# Patient Record
Sex: Male | Born: 1965 | Race: Black or African American | Hispanic: No | Marital: Married | Smoking: Never smoker
Health system: Southern US, Community
[De-identification: ages and names within clinical notes are randomized; demographics above are authoritative.]

---

## 2015-10-15 ENCOUNTER — Encounter (HOSPITAL_COMMUNITY): Payer: Self-pay | Admitting: Physical Medicine and Rehabilitation

## 2015-10-15 ENCOUNTER — Emergency Department (HOSPITAL_COMMUNITY): Payer: No Typology Code available for payment source

## 2015-10-15 ENCOUNTER — Emergency Department (HOSPITAL_COMMUNITY)
Admission: EM | Admit: 2015-10-15 | Discharge: 2015-10-15 | Disposition: A | Discharge status: Home / Self Care | Payer: No Typology Code available for payment source | Attending: Emergency Medicine | Admitting: Emergency Medicine

## 2015-10-15 DIAGNOSIS — S3992XA Unspecified injury of lower back, initial encounter: Secondary | ICD-10-CM | POA: Insufficient documentation

## 2015-10-15 DIAGNOSIS — M549 Dorsalgia, unspecified: Secondary | ICD-10-CM

## 2015-10-15 DIAGNOSIS — Y9389 Activity, other specified: Secondary | ICD-10-CM | POA: Insufficient documentation

## 2015-10-15 DIAGNOSIS — S0990XA Unspecified injury of head, initial encounter: Secondary | ICD-10-CM | POA: Insufficient documentation

## 2015-10-15 DIAGNOSIS — S4992XA Unspecified injury of left shoulder and upper arm, initial encounter: Secondary | ICD-10-CM | POA: Insufficient documentation

## 2015-10-15 DIAGNOSIS — S199XXA Unspecified injury of neck, initial encounter: Secondary | ICD-10-CM | POA: Diagnosis not present

## 2015-10-15 DIAGNOSIS — S4991XA Unspecified injury of right shoulder and upper arm, initial encounter: Secondary | ICD-10-CM | POA: Diagnosis not present

## 2015-10-15 DIAGNOSIS — S299XXA Unspecified injury of thorax, initial encounter: Secondary | ICD-10-CM | POA: Diagnosis not present

## 2015-10-15 DIAGNOSIS — Y92410 Unspecified street and highway as the place of occurrence of the external cause: Secondary | ICD-10-CM | POA: Insufficient documentation

## 2015-10-15 DIAGNOSIS — M542 Cervicalgia: Secondary | ICD-10-CM

## 2015-10-15 DIAGNOSIS — S8992XA Unspecified injury of left lower leg, initial encounter: Secondary | ICD-10-CM | POA: Diagnosis present

## 2015-10-15 DIAGNOSIS — Y999 Unspecified external cause status: Secondary | ICD-10-CM | POA: Diagnosis not present

## 2015-10-15 DIAGNOSIS — M25562 Pain in left knee: Secondary | ICD-10-CM

## 2015-10-15 MED ORDER — CYCLOBENZAPRINE HCL 10 MG PO TABS: 10.0000 mg | ORAL_TABLET | Freq: Three times a day (TID) | ORAL | Status: DC | PRN

## 2015-10-15 MED ORDER — DIAZEPAM 5 MG PO TABS: 5.0000 mg | ORAL_TABLET | Freq: Two times a day (BID) | ORAL | Status: DC | PRN

## 2015-10-15 MED ORDER — IBUPROFEN 800 MG PO TABS: 800.0000 mg | ORAL_TABLET | Freq: Three times a day (TID) | ORAL | Status: DC | PRN
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
## 2015-10-15 NOTE — ED Notes (Signed)
Declined W/C at D/C and was escorted to lobby by RN. 
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
## 2022-12-23 ENCOUNTER — Emergency Department (HOSPITAL_BASED_OUTPATIENT_CLINIC_OR_DEPARTMENT_OTHER): Payer: No Typology Code available for payment source

## 2022-12-23 ENCOUNTER — Other Ambulatory Visit: Payer: Self-pay

## 2022-12-23 ENCOUNTER — Inpatient Hospital Stay (HOSPITAL_COMMUNITY)
Admission: EM | Disposition: A | Discharge status: Home / Self Care | Payer: Self-pay | Source: Home / Self Care | Attending: Cardiology

## 2022-12-23 ENCOUNTER — Inpatient Hospital Stay (HOSPITAL_BASED_OUTPATIENT_CLINIC_OR_DEPARTMENT_OTHER)
Admission: EM | Admit: 2022-12-23 | Discharge: 2022-12-30 | DRG: 280 | Disposition: A | Discharge status: Home / Self Care | Payer: No Typology Code available for payment source | Attending: Cardiology | Admitting: Cardiology

## 2022-12-23 DIAGNOSIS — I2111 ST elevation (STEMI) myocardial infarction involving right coronary artery: Secondary | ICD-10-CM

## 2022-12-23 DIAGNOSIS — Z881 Allergy status to other antibiotic agents status: Secondary | ICD-10-CM | POA: Diagnosis not present

## 2022-12-23 DIAGNOSIS — I2119 ST elevation (STEMI) myocardial infarction involving other coronary artery of inferior wall: Secondary | ICD-10-CM | POA: Diagnosis present

## 2022-12-23 DIAGNOSIS — I441 Atrioventricular block, second degree: Secondary | ICD-10-CM | POA: Diagnosis not present

## 2022-12-23 DIAGNOSIS — E1165 Type 2 diabetes mellitus with hyperglycemia: Secondary | ICD-10-CM | POA: Diagnosis present

## 2022-12-23 DIAGNOSIS — Y9367 Activity, basketball: Secondary | ICD-10-CM | POA: Diagnosis not present

## 2022-12-23 DIAGNOSIS — I252 Old myocardial infarction: Secondary | ICD-10-CM

## 2022-12-23 DIAGNOSIS — I442 Atrioventricular block, complete: Secondary | ICD-10-CM | POA: Diagnosis present

## 2022-12-23 DIAGNOSIS — E118 Type 2 diabetes mellitus with unspecified complications: Secondary | ICD-10-CM | POA: Insufficient documentation

## 2022-12-23 DIAGNOSIS — E11649 Type 2 diabetes mellitus with hypoglycemia without coma: Secondary | ICD-10-CM | POA: Diagnosis not present

## 2022-12-23 DIAGNOSIS — N179 Acute kidney failure, unspecified: Secondary | ICD-10-CM | POA: Diagnosis not present

## 2022-12-23 DIAGNOSIS — I110 Hypertensive heart disease with heart failure: Secondary | ICD-10-CM | POA: Diagnosis present

## 2022-12-23 DIAGNOSIS — I10 Essential (primary) hypertension: Secondary | ICD-10-CM | POA: Insufficient documentation

## 2022-12-23 DIAGNOSIS — E785 Hyperlipidemia, unspecified: Secondary | ICD-10-CM | POA: Diagnosis present

## 2022-12-23 DIAGNOSIS — I319 Disease of pericardium, unspecified: Secondary | ICD-10-CM | POA: Diagnosis present

## 2022-12-23 DIAGNOSIS — Z833 Family history of diabetes mellitus: Secondary | ICD-10-CM

## 2022-12-23 DIAGNOSIS — I5021 Acute systolic (congestive) heart failure: Secondary | ICD-10-CM | POA: Diagnosis present

## 2022-12-23 DIAGNOSIS — I213 ST elevation (STEMI) myocardial infarction of unspecified site: Secondary | ICD-10-CM | POA: Diagnosis present

## 2022-12-23 DIAGNOSIS — I2510 Atherosclerotic heart disease of native coronary artery without angina pectoris: Secondary | ICD-10-CM | POA: Diagnosis present

## 2022-12-23 DIAGNOSIS — Z8249 Family history of ischemic heart disease and other diseases of the circulatory system: Secondary | ICD-10-CM | POA: Diagnosis not present

## 2022-12-23 DIAGNOSIS — I4430 Unspecified atrioventricular block: Secondary | ICD-10-CM | POA: Diagnosis not present

## 2022-12-23 DIAGNOSIS — Z91199 Patient's noncompliance with other medical treatment and regimen due to unspecified reason: Secondary | ICD-10-CM

## 2022-12-23 DIAGNOSIS — I5020 Unspecified systolic (congestive) heart failure: Secondary | ICD-10-CM | POA: Insufficient documentation

## 2022-12-23 DIAGNOSIS — I255 Ischemic cardiomyopathy: Secondary | ICD-10-CM | POA: Diagnosis present

## 2022-12-23 DIAGNOSIS — X58XXXA Exposure to other specified factors, initial encounter: Secondary | ICD-10-CM | POA: Diagnosis present

## 2022-12-23 DIAGNOSIS — N289 Disorder of kidney and ureter, unspecified: Secondary | ICD-10-CM | POA: Diagnosis not present

## 2022-12-23 HISTORY — PX: LEFT HEART CATH AND CORONARY ANGIOGRAPHY: CATH118249

## 2022-12-23 HISTORY — PX: CORONARY/GRAFT ACUTE MI REVASCULARIZATION: CATH118305

## 2022-12-23 LAB — CBC WITH DIFFERENTIAL/PLATELET
Abs Immature Granulocytes: 0.07 10*3/uL (ref 0.00–0.07)
Basophils Absolute: 0 10*3/uL (ref 0.0–0.1)
Basophils Relative: 0 %
Eosinophils Absolute: 0 10*3/uL (ref 0.0–0.5)
Eosinophils Relative: 0 %
HCT: 38.7 % — ABNORMAL LOW (ref 39.0–52.0)
Hemoglobin: 13.1 g/dL (ref 13.0–17.0)
Immature Granulocytes: 1 %
Lymphocytes Relative: 7 %
Lymphs Abs: 0.9 10*3/uL (ref 0.7–4.0)
MCH: 29.2 pg (ref 26.0–34.0)
MCHC: 33.9 g/dL (ref 30.0–36.0)
MCV: 86.2 fL (ref 80.0–100.0)
Monocytes Absolute: 1.8 10*3/uL — ABNORMAL HIGH (ref 0.1–1.0)
Monocytes Relative: 14 %
Neutro Abs: 10.2 10*3/uL — ABNORMAL HIGH (ref 1.7–7.7)
Neutrophils Relative %: 78 %
Platelets: 284 10*3/uL (ref 150–400)
RBC: 4.49 MIL/uL (ref 4.22–5.81)
RDW: 12.5 % (ref 11.5–15.5)
WBC: 13.1 10*3/uL — ABNORMAL HIGH (ref 4.0–10.5)
nRBC: 0 % (ref 0.0–0.2)

## 2022-12-23 LAB — COMPREHENSIVE METABOLIC PANEL
ALT: 28 U/L (ref 0–44)
AST: 53 U/L — ABNORMAL HIGH (ref 15–41)
Albumin: 3.8 g/dL (ref 3.5–5.0)
Alkaline Phosphatase: 92 U/L (ref 38–126)
Anion gap: 11 (ref 5–15)
BUN: 25 mg/dL — ABNORMAL HIGH (ref 6–20)
CO2: 25 mmol/L (ref 22–32)
Calcium: 8.5 mg/dL — ABNORMAL LOW (ref 8.9–10.3)
Chloride: 96 mmol/L — ABNORMAL LOW (ref 98–111)
Creatinine, Ser: 1.3 mg/dL — ABNORMAL HIGH (ref 0.61–1.24)
GFR, Estimated: >60 mL/min (ref >60)
Glucose, Bld: 342 mg/dL — ABNORMAL HIGH (ref 70–99)
Potassium: 3.8 mmol/L (ref 3.5–5.1)
Sodium: 132 mmol/L — ABNORMAL LOW (ref 135–145)
Total Bilirubin: 1.3 mg/dL — ABNORMAL HIGH (ref 0.3–1.2)
Total Protein: 7.8 g/dL (ref 6.5–8.1)

## 2022-12-23 LAB — LIPID PANEL

## 2022-12-23 LAB — TROPONIN I (HIGH SENSITIVITY): Troponin I (High Sensitivity): 4852 ng/L (ref <18)

## 2022-12-23 LAB — PROTIME-INR
INR: 1.2 (ref 0.8–1.2)
Prothrombin Time: 14.6 seconds (ref 11.4–15.2)

## 2022-12-23 LAB — APTT: aPTT: 30 seconds (ref 24–36)

## 2022-12-23 SURGERY — CORONARY/GRAFT ACUTE MI REVASCULARIZATION
Anesthesia: LOCAL

## 2022-12-23 MED ORDER — HEPARIN (PORCINE) IN NACL 1000-0.9 UT/500ML-% IV SOLN
INTRAVENOUS | Status: DC | PRN
  Administered 2022-12-23 (×2): 500 mL

## 2022-12-23 MED ORDER — CHLORHEXIDINE GLUCONATE CLOTH 2 % EX PADS: 6.0000 | MEDICATED_PAD | Freq: Every day | CUTANEOUS | Status: DC

## 2022-12-23 MED ORDER — IOHEXOL 350 MG/ML SOLN
INTRAVENOUS | Status: DC | PRN
  Administered 2022-12-23: 40 mL via INTRA_ARTERIAL

## 2022-12-23 MED ORDER — HEPARIN SODIUM (PORCINE) 5000 UNIT/ML IJ SOLN
4000.0000 [IU] | Freq: Once | INTRAMUSCULAR | Status: AC
  Administered 2022-12-23: 4000 [IU] via INTRAVENOUS

## 2022-12-23 MED ORDER — SODIUM CHLORIDE 0.9 % IV SOLN: INTRAVENOUS | Status: DC

## 2022-12-23 MED ORDER — ASPIRIN 81 MG PO CHEW
324.0000 mg | CHEWABLE_TABLET | Freq: Once | ORAL | Status: AC
  Administered 2022-12-23: 324 mg via ORAL
  Filled 2022-12-23: qty 4

## 2022-12-23 MED ORDER — HEPARIN SODIUM (PORCINE) 5000 UNIT/ML IJ SOLN
INTRAMUSCULAR | Status: AC
  Filled 2022-12-23: qty 1

## 2022-12-23 MED ORDER — HEPARIN (PORCINE) 25000 UT/250ML-% IV SOLN
1200.0000 [IU]/h | INTRAVENOUS | Status: DC
  Administered 2022-12-23: 1200 [IU]/h via INTRAVENOUS
  Filled 2022-12-23: qty 250

## 2022-12-23 MED ORDER — NITROGLYCERIN 0.4 MG SL SUBL
0.4000 mg | SUBLINGUAL_TABLET | SUBLINGUAL | Status: DC | PRN
  Administered 2022-12-23 (×2): 0.4 mg via SUBLINGUAL
  Filled 2022-12-23: qty 1

## 2022-12-23 MED ORDER — HEPARIN SODIUM (PORCINE) 1000 UNIT/ML IJ SOLN
INTRAMUSCULAR | Status: DC | PRN
  Administered 2022-12-23: 4500 [IU] via INTRAVENOUS

## 2022-12-23 MED ORDER — CLOPIDOGREL BISULFATE 300 MG PO TABS
600.0000 mg | ORAL_TABLET | Freq: Once | ORAL | Status: DC
  Filled 2022-12-23: qty 2

## 2022-12-23 MED ORDER — VERAPAMIL HCL 2.5 MG/ML IV SOLN
INTRAVENOUS | Status: DC | PRN
  Administered 2022-12-23: 10 mL via INTRA_ARTERIAL

## 2022-12-23 MED ORDER — ORAL CARE MOUTH RINSE: 15.0000 mL | OROMUCOSAL | Status: DC | PRN

## 2022-12-23 SURGICAL SUPPLY — 11 items
CATH 5FR JL3.5 JR4 ANG PIG MP (CATHETERS) IMPLANT
DEVICE RAD COMP TR BAND LRG (VASCULAR PRODUCTS) IMPLANT
ELECT DEFIB PAD ADLT CADENCE (PAD) IMPLANT
GLIDESHEATH SLEND SS 6F .021 (SHEATH) IMPLANT
GUIDEWIRE INQWIRE 1.5J.035X260 (WIRE) IMPLANT
INQWIRE 1.5J .035X260CM (WIRE) ×1
KIT ENCORE 26 ADVANTAGE (KITS) IMPLANT
KIT HEART LEFT (KITS) ×1 IMPLANT
PACK CARDIAC CATHETERIZATION (CUSTOM PROCEDURE TRAY) ×1 IMPLANT
TRANSDUCER W/STOPCOCK (MISCELLANEOUS) ×1 IMPLANT
TUBING CIL FLEX 10 FLL-RA (TUBING) ×1 IMPLANT

## 2022-12-23 NOTE — ED Triage Notes (Addendum)
Pt has had CP since Friday and worse since Saturday especially since playing basketball - got SOB and weak and cannot take a full breath.  Pt has not taken his BP meds since Saturday d/t thinking that was what was causing pain.

## 2022-12-23 NOTE — Discharge Instructions (Signed)
You are being transported directly to the [facility] for a heart attack

## 2022-12-23 NOTE — ED Provider Notes (Signed)
[place] [facility] AT [facility] Provider Note   CSN: [id number] Arrival date & time: 12/23/22  2154     History  Chief Complaint  Patient presents with   Code STEMI    Ryan Petersen is a 57 y.o. male.   Chest Pain     57 year old male with medical history significant for hypertension who presents to the emergency department with substernal chest pain.  He states that the pain started on Saturday.  He noticed it especially while playing basketball.  He felt weak and shortness of breath and has been feeling somewhat diaphoretic.  He has felt lightheaded as well with no syncope.  No ripping or tearing component of his chest pain.  No radiation of his chest pain to the back.  No red migratory component.  He describes it as a chest pressure sitting on the center of his chest.  He initially thought it was because he did not take his blood pressure medications and that is why he was having pain.  He did not seek care until now.  He arrived to the emergency department where he had ST segment elevations in leads II, III and aVF with Q waves consistent with STEMI and a code STEMI was called on patient arrival. Home Medications Prior to Admission medications   Medication Sig Start Date End Date Taking? Authorizing Provider  cyclobenzaprine (FLEXERIL) 10 MG tablet Take 1 tablet (10 mg total) by mouth 3 (three) times daily as needed for muscle spasms (or pain). 10/15/15   Trixie Dredge, PA-C  diazepam (VALIUM) 5 MG tablet Take 1 tablet (5 mg total) by mouth every 12 (twelve) hours as needed for muscle spasms (spasms). 10/15/15   Trixie Dredge, PA-C  ibuprofen (ADVIL,MOTRIN) 800 MG tablet Take 1 tablet (800 mg total) by mouth every 8 (eight) hours as needed for mild pain or moderate pain. 10/15/15   Trixie Dredge, PA-C      Allergies    Patient has no known allergies.    Review of Systems   Review of Systems  Cardiovascular:  Positive for chest pain.  All other systems reviewed  and are negative.   Physical Exam Updated Vital Signs BP (!) 179/96   Pulse (!) 125   Temp 99.1 F (37.3 C)   Resp 17   Ht 5\' 10"  (1.778 m)   Wt 87.1 kg   SpO2 97%   BMI 27.55 kg/m  Physical Exam Vitals and nursing note reviewed.  Constitutional:      General: He is not in acute distress.    Appearance: He is well-developed. He is diaphoretic.     Comments: Patient ill-appearing and diaphoretic  HENT:     Head: Normocephalic and atraumatic.  Eyes:     Conjunctiva/sclera: Conjunctivae normal.  Cardiovascular:     Rate and Rhythm: Regular rhythm. Tachycardia present.     Pulses: Normal pulses.  Pulmonary:     Effort: Pulmonary effort is normal. No respiratory distress.     Breath sounds: Normal breath sounds.  Abdominal:     Palpations: Abdomen is soft.     Tenderness: There is no abdominal tenderness.  Musculoskeletal:        General: No swelling.     Cervical back: Neck supple.  Skin:    General: Skin is warm.     Capillary Refill: Capillary refill takes less than 2 seconds.  Neurological:     Mental Status: He is alert.  Psychiatric:  Mood and Affect: Mood normal.     ED Results / Procedures / Treatments   Labs (all labs ordered are listed, but only abnormal results are displayed) Labs Reviewed  CBC WITH DIFFERENTIAL/PLATELET - Abnormal; Notable for the following components:      Result Value   WBC 13.1 (*)    HCT 38.7 (*)    All other components within normal limits  PROTIME-INR  APTT  HEMOGLOBIN A1C  COMPREHENSIVE METABOLIC PANEL  LIPID PANEL  TROPONIN I (HIGH SENSITIVITY)    EKG EKG Interpretation  Date/Time:  Monday December 23 2022 22:03:15 EDT Ventricular Rate:  123 PR Interval:  196 QRS Duration: 114 QT Interval:  296 QTC Calculation: 424 R Axis:   83 Text Interpretation: Sinus tachycardia Borderline prolonged PR interval LAE, consider biatrial enlargement Inferior infarct, acute (LCx) Lateral leads are also involved >>> Acute MI  <<< ** ** ACUTE MI / STEMI ** ** Confirmed by Ernie Avena ([id number]) on 12/23/2022 10:10:52 PM  Radiology No results found.  Procedures .Critical Care  Performed by: Ernie Avena, MD Authorized by: Ernie Avena, MD   Critical care provider statement:    Critical care time (minutes):  30   Critical care was necessary to treat or prevent imminent or life-threatening deterioration of the following conditions:  Cardiac failure   Critical care was time spent personally by me on the following activities:  Development of treatment plan with patient or surrogate, discussions with consultants, evaluation of patient's response to treatment, examination of patient, ordering and review of laboratory studies, ordering and review of radiographic studies, ordering and performing treatments and interventions, pulse oximetry, re-evaluation of patient's condition and review of old charts   Care discussed with: accepting provider at another facility       Medications Ordered in ED Medications  0.9 %  sodium chloride infusion ( Intravenous New Bag/Given 12/23/22 2214)  nitroGLYCERIN (NITROSTAT) SL tablet 0.4 mg (0.4 mg Sublingual Given 12/23/22 2228)  heparin ADULT infusion 100 units/mL (25000 units/243mL) (1,200 Units/hr Intravenous New Bag/Given 12/23/22 2231)  aspirin chewable tablet 324 mg (324 mg Oral Given 12/23/22 2210)  heparin injection 4,000 Units (4,000 Units Intravenous Given 12/23/22 2213)    ED Course/ Medical Decision Making/ A&P                             Medical Decision Making Amount and/or Complexity of Data Reviewed Labs: ordered. Radiology: ordered.  Risk OTC drugs. Prescription drug management.   57 year old male with medical history significant for hypertension who presents to the emergency department with substernal chest pain.  He states that the pain started on Saturday.  He noticed it especially while playing basketball.  He felt weak and shortness of breath and has been  feeling somewhat diaphoretic.  He has felt lightheaded as well with no syncope.  No ripping or tearing component of his chest pain.  No radiation of his chest pain to the back.  No red migratory component.  He describes it as a chest pressure sitting on the center of his chest.  He initially thought it was because he did not take his blood pressure medications and that is why he was having pain.  He did not seek care until now.  He arrived to the emergency department where he had ST segment elevations in leads II, III and aVF with Q waves consistent with STEMI and a code STEMI was called on patient arrival.  Initially  care EGD revealed sinus tachycardia, ventricular rate 123, inferior infarct with ST segment elevations in leads II, III and aVF with associated Q waves, ST depressions in lead aVL.   Discussed with on-call STEMI doctor, Dr. Swaziland who agreed with code STEMI.  The patient was emergently bedded and was administered aspirin, heparin bolus and gtt.  He remained hemodynamically stable.  Low concern for aortic dissection with no ripping or tearing component, symmetric and equal pulses.  He was placed on cardiac Zoll monitoring.  [facility] was contacted for emergent transfer to [facility] for consideration for PCI, Dr. Swaziland accepting.   Final Clinical Impression(s) / ED Diagnoses Final diagnoses:  ST elevation myocardial infarction (STEMI), unspecified artery    Rx / DC Orders ED Discharge Orders     None         Ernie Avena, MD 12/23/22 2233

## 2022-12-23 NOTE — Progress Notes (Signed)
ANTICOAGULATION CONSULT NOTE - Initial Consult  Pharmacy Consult for Heparin Indication: chest pain/ACS  No Known Allergies  Patient Measurements: Height: 5\' 10"  (177.8 cm) Weight: 87.1 kg (192 lb) IBW/kg (Calculated) : 73 Heparin Dosing Weight: 87.1 kg  Vital Signs: Temp: 99.1 F (37.3 C) (04/15 2159) BP: 177/109 (04/15 2159) Pulse Rate: 127 (04/15 2159)  Labs: Recent Labs    12/23/22 2210  HGB 13.1  HCT 38.7*  PLT 284    CrCl cannot be calculated (No successful lab value found.).   Medical History: No past medical history on file.  Medications:  (Not in a hospital admission)  Scheduled:  Infusions:   sodium chloride 20 mL/hr at 12/23/22 2214   PRN: nitroGLYCERIN  Assessment: 56 yom with a history of HTN. Patient is presenting with chest pain. Code STEMI activated. Heparin per pharmacy consult placed for chest pain/ACS.  Patient is not on anticoagulation prior to arrival. [facility] ED gave 4000 unit IV heparin prior to pharmacy consult. Patient is pending [facility] transport to [facility] for STEMI activation/evaluation.  CBC pending  Goal of Therapy:  Heparin level 0.3-0.7 units/ml Monitor platelets by anticoagulation protocol: Yes   Plan:  4000 unit IV heparin bolus already given Start heparin infusion at 1200 units/hr Check anti-Xa level in 6-8 hours and daily while on heparin --Heparin levels not ordered as will need to be re-ordered on arrival to [facility] and may go to cath lab on arrival anyway Continue to monitor H&H and platelets  Delmar Landau, PharmD, BCPS 12/23/2022 10:25 PM ED Clinical Pharmacist -  [phone]

## 2022-12-23 NOTE — ED Notes (Signed)
Called [facility] for code STEMI and spoke to Amo.

## 2022-12-24 ENCOUNTER — Inpatient Hospital Stay (HOSPITAL_COMMUNITY): Payer: No Typology Code available for payment source

## 2022-12-24 ENCOUNTER — Encounter (HOSPITAL_COMMUNITY): Payer: Self-pay | Admitting: Cardiology

## 2022-12-24 ENCOUNTER — Encounter (HOSPITAL_COMMUNITY)
Admission: EM | Disposition: A | Discharge status: Home / Self Care | Payer: Self-pay | Source: Home / Self Care | Attending: Cardiology

## 2022-12-24 DIAGNOSIS — I213 ST elevation (STEMI) myocardial infarction of unspecified site: Secondary | ICD-10-CM

## 2022-12-24 DIAGNOSIS — I441 Atrioventricular block, second degree: Secondary | ICD-10-CM

## 2022-12-24 DIAGNOSIS — E11649 Type 2 diabetes mellitus with hypoglycemia without coma: Secondary | ICD-10-CM

## 2022-12-24 DIAGNOSIS — I110 Hypertensive heart disease with heart failure: Secondary | ICD-10-CM | POA: Diagnosis not present

## 2022-12-24 DIAGNOSIS — I5021 Acute systolic (congestive) heart failure: Secondary | ICD-10-CM | POA: Diagnosis not present

## 2022-12-24 DIAGNOSIS — I10 Essential (primary) hypertension: Secondary | ICD-10-CM

## 2022-12-24 DIAGNOSIS — I2111 ST elevation (STEMI) myocardial infarction involving right coronary artery: Secondary | ICD-10-CM | POA: Diagnosis not present

## 2022-12-24 DIAGNOSIS — N289 Disorder of kidney and ureter, unspecified: Secondary | ICD-10-CM

## 2022-12-24 DIAGNOSIS — R079 Chest pain, unspecified: Secondary | ICD-10-CM

## 2022-12-24 DIAGNOSIS — I442 Atrioventricular block, complete: Secondary | ICD-10-CM | POA: Diagnosis not present

## 2022-12-24 DIAGNOSIS — I4430 Unspecified atrioventricular block: Secondary | ICD-10-CM | POA: Diagnosis not present

## 2022-12-24 HISTORY — PX: TEMPORARY PACEMAKER: CATH118268

## 2022-12-24 LAB — ECHOCARDIOGRAM COMPLETE
Height: 70 in
S' Lateral: 3.7 cm
Weight: 3072 oz

## 2022-12-24 LAB — BASIC METABOLIC PANEL
Anion gap: 15 (ref 5–15)
BUN: 23 mg/dL — ABNORMAL HIGH (ref 6–20)
CO2: 23 mmol/L (ref 22–32)
Calcium: 8.6 mg/dL — ABNORMAL LOW (ref 8.9–10.3)
Chloride: 95 mmol/L — ABNORMAL LOW (ref 98–111)
Creatinine, Ser: 1.41 mg/dL — ABNORMAL HIGH (ref 0.61–1.24)
GFR, Estimated: 58 mL/min — ABNORMAL LOW (ref >60)
Glucose, Bld: 358 mg/dL — ABNORMAL HIGH (ref 70–99)
Potassium: 3.9 mmol/L (ref 3.5–5.1)
Sodium: 133 mmol/L — ABNORMAL LOW (ref 135–145)

## 2022-12-24 LAB — GLUCOSE, CAPILLARY
Glucose-Capillary: 388 mg/dL — ABNORMAL HIGH (ref 70–99)
Glucose-Capillary: 310 mg/dL — ABNORMAL HIGH (ref 70–99)
Glucose-Capillary: 282 mg/dL — ABNORMAL HIGH (ref 70–99)
Glucose-Capillary: 195 mg/dL — ABNORMAL HIGH (ref 70–99)
Glucose-Capillary: 169 mg/dL — ABNORMAL HIGH (ref 70–99)

## 2022-12-24 LAB — HEMOGLOBIN A1C
Hgb A1c MFr Bld: 13.2 % — ABNORMAL HIGH (ref 4.8–5.6)
Mean Plasma Glucose: 332.14 mg/dL

## 2022-12-24 LAB — SEDIMENTATION RATE: Sed Rate: 80 mm/hr — ABNORMAL HIGH (ref 0–16)

## 2022-12-24 LAB — LIPID PANEL
Cholesterol: 189 mg/dL (ref 0–200)
Total CHOL/HDL Ratio: 3.4 RATIO
Triglycerides: 106 mg/dL (ref <150)

## 2022-12-24 LAB — MAGNESIUM: Magnesium: 2 mg/dL (ref 1.7–2.4)

## 2022-12-24 LAB — TROPONIN I (HIGH SENSITIVITY): Troponin I (High Sensitivity): 4524 ng/L (ref <18)

## 2022-12-24 LAB — HEPARIN LEVEL (UNFRACTIONATED): Heparin Unfractionated: <0.1 IU/mL — ABNORMAL LOW (ref 0.30–0.70)

## 2022-12-24 LAB — MRSA NEXT GEN BY PCR, NASAL: MRSA by PCR Next Gen: NOT DETECTED

## 2022-12-24 SURGERY — TEMPORARY PACEMAKER
Anesthesia: LOCAL

## 2022-12-24 MED ORDER — INSULIN ASPART 100 UNIT/ML IJ SOLN: 0.0000 [IU] | Freq: Every day | INTRAMUSCULAR | Status: DC

## 2022-12-24 MED ORDER — ONDANSETRON HCL 4 MG/2ML IJ SOLN: 4.0000 mg | Freq: Four times a day (QID) | INTRAMUSCULAR | Status: DC | PRN

## 2022-12-24 MED ORDER — CLOPIDOGREL BISULFATE 75 MG PO TABS
75.0000 mg | ORAL_TABLET | Freq: Every day | ORAL | Status: DC
  Administered 2022-12-25 – 2022-12-30 (×6): 75 mg via ORAL
  Filled 2022-12-24 (×6): qty 1

## 2022-12-24 MED ORDER — NITROGLYCERIN IN D5W 200-5 MCG/ML-% IV SOLN
0.0000 ug/min | INTRAVENOUS | Status: DC
  Administered 2022-12-24: 5 ug/min via INTRAVENOUS
  Filled 2022-12-24: qty 250

## 2022-12-24 MED ORDER — MORPHINE SULFATE (PF) 2 MG/ML IV SOLN
2.0000 mg | INTRAVENOUS | Status: DC | PRN
  Administered 2022-12-24 – 2022-12-26 (×8): 2 mg via INTRAVENOUS
  Filled 2022-12-24 (×8): qty 1

## 2022-12-24 MED ORDER — MIDAZOLAM HCL 2 MG/2ML IJ SOLN
INTRAMUSCULAR | Status: DC | PRN
  Administered 2022-12-24 (×2): 1 mg via INTRAVENOUS

## 2022-12-24 MED ORDER — INSULIN ASPART 100 UNIT/ML IJ SOLN
0.0000 [IU] | Freq: Three times a day (TID) | INTRAMUSCULAR | Status: DC
  Administered 2022-12-24: 4 [IU] via SUBCUTANEOUS
  Administered 2022-12-25: 7 [IU] via SUBCUTANEOUS
  Administered 2022-12-25: 4 [IU] via SUBCUTANEOUS
  Administered 2022-12-25: 3 [IU] via SUBCUTANEOUS
  Administered 2022-12-26: 7 [IU] via SUBCUTANEOUS
  Administered 2022-12-26 – 2022-12-27 (×2): 3 [IU] via SUBCUTANEOUS
  Administered 2022-12-28 – 2022-12-29 (×2): 4 [IU] via SUBCUTANEOUS

## 2022-12-24 MED ORDER — ATORVASTATIN CALCIUM 80 MG PO TABS
80.0000 mg | ORAL_TABLET | Freq: Every day | ORAL | Status: DC
  Administered 2022-12-24 – 2022-12-30 (×7): 80 mg via ORAL
  Filled 2022-12-24 (×7): qty 1

## 2022-12-24 MED ORDER — LIDOCAINE HCL (PF) 1 % IJ SOLN
INTRAMUSCULAR | Status: DC | PRN
  Administered 2022-12-24: 10 mL

## 2022-12-24 MED ORDER — SODIUM CHLORIDE 0.9 % IV SOLN
250.0000 mL | INTRAVENOUS | Status: DC | PRN
  Administered 2022-12-27: 250 mL via INTRAVENOUS

## 2022-12-24 MED ORDER — ORAL CARE MOUTH RINSE: 15.0000 mL | OROMUCOSAL | Status: DC | PRN

## 2022-12-24 MED ORDER — CLOPIDOGREL BISULFATE 300 MG PO TABS
300.0000 mg | ORAL_TABLET | Freq: Once | ORAL | Status: AC
  Administered 2022-12-24: 300 mg via ORAL
  Filled 2022-12-24: qty 1

## 2022-12-24 MED ORDER — GABAPENTIN 300 MG PO CAPS
600.0000 mg | ORAL_CAPSULE | Freq: Two times a day (BID) | ORAL | Status: DC
  Administered 2022-12-24 – 2022-12-29 (×11): 600 mg via ORAL
  Filled 2022-12-24 (×11): qty 2

## 2022-12-24 MED ORDER — GABAPENTIN 300 MG PO CAPS
900.0000 mg | ORAL_CAPSULE | Freq: Three times a day (TID) | ORAL | Status: DC
  Administered 2022-12-24: 900 mg via ORAL
  Filled 2022-12-24 (×2): qty 3

## 2022-12-24 MED ORDER — INSULIN ASPART 100 UNIT/ML IJ SOLN
4.0000 [IU] | Freq: Three times a day (TID) | INTRAMUSCULAR | Status: DC
  Administered 2022-12-24 – 2022-12-29 (×14): 4 [IU] via SUBCUTANEOUS

## 2022-12-24 MED ORDER — ACETAMINOPHEN 325 MG PO TABS
650.0000 mg | ORAL_TABLET | ORAL | Status: DC | PRN
  Filled 2022-12-24: qty 2

## 2022-12-24 MED ORDER — TRAMADOL HCL 50 MG PO TABS
100.0000 mg | ORAL_TABLET | Freq: Two times a day (BID) | ORAL | Status: DC | PRN
  Administered 2022-12-24 – 2022-12-29 (×12): 100 mg via ORAL
  Filled 2022-12-24 (×12): qty 2

## 2022-12-24 MED ORDER — HYDRALAZINE HCL 20 MG/ML IJ SOLN: 10.0000 mg | INTRAMUSCULAR | Status: AC | PRN

## 2022-12-24 MED ORDER — COLCHICINE 0.6 MG PO TABS
0.6000 mg | ORAL_TABLET | Freq: Two times a day (BID) | ORAL | Status: DC
  Administered 2022-12-24 – 2022-12-30 (×13): 0.6 mg via ORAL
  Filled 2022-12-24 (×14): qty 1

## 2022-12-24 MED ORDER — SODIUM CHLORIDE 0.9 % WEIGHT BASED INFUSION
1.0000 mL/kg/h | INTRAVENOUS | Status: AC
  Administered 2022-12-24: 500 mL via INTRAVENOUS

## 2022-12-24 MED ORDER — ISOSORBIDE MONONITRATE ER 30 MG PO TB24
15.0000 mg | ORAL_TABLET | Freq: Every day | ORAL | Status: DC
  Administered 2022-12-24: 15 mg via ORAL
  Filled 2022-12-24: qty 1

## 2022-12-24 MED ORDER — SODIUM CHLORIDE 0.9% FLUSH
3.0000 mL | Freq: Two times a day (BID) | INTRAVENOUS | Status: DC
  Administered 2022-12-24 – 2022-12-30 (×13): 3 mL via INTRAVENOUS

## 2022-12-24 MED ORDER — HEPARIN (PORCINE) 25000 UT/250ML-% IV SOLN
1600.0000 [IU]/h | INTRAVENOUS | Status: AC
  Administered 2022-12-24: 1200 [IU]/h via INTRAVENOUS
  Administered 2022-12-25 (×2): 1600 [IU]/h via INTRAVENOUS
  Filled 2022-12-24 (×3): qty 250

## 2022-12-24 MED ORDER — ASPIRIN 81 MG PO CHEW
81.0000 mg | CHEWABLE_TABLET | Freq: Every day | ORAL | Status: DC
  Administered 2022-12-24 – 2022-12-30 (×7): 81 mg via ORAL
  Filled 2022-12-24 (×7): qty 1

## 2022-12-24 MED ORDER — HEPARIN BOLUS VIA INFUSION
2600.0000 [IU] | Freq: Once | INTRAVENOUS | Status: AC
  Administered 2022-12-24: 2600 [IU] via INTRAVENOUS
  Filled 2022-12-24: qty 2600

## 2022-12-24 MED ORDER — FENTANYL CITRATE (PF) 100 MCG/2ML IJ SOLN
INTRAMUSCULAR | Status: DC | PRN
  Administered 2022-12-24 (×2): 25 ug via INTRAVENOUS

## 2022-12-24 MED ORDER — CHLORHEXIDINE GLUCONATE CLOTH 2 % EX PADS
6.0000 | MEDICATED_PAD | Freq: Every day | CUTANEOUS | Status: DC
  Administered 2022-12-24 – 2022-12-30 (×7): 6 via TOPICAL

## 2022-12-24 MED ORDER — INSULIN DETEMIR 100 UNIT/ML ~~LOC~~ SOLN
15.0000 [IU] | Freq: Every day | SUBCUTANEOUS | Status: DC
  Administered 2022-12-24 – 2022-12-25 (×2): 15 [IU] via SUBCUTANEOUS
  Filled 2022-12-24 (×2): qty 0.15

## 2022-12-24 MED ORDER — SODIUM CHLORIDE 0.9% FLUSH: 3.0000 mL | INTRAVENOUS | Status: DC | PRN

## 2022-12-24 MED ORDER — INSULIN ASPART 100 UNIT/ML IJ SOLN
0.0000 [IU] | Freq: Three times a day (TID) | INTRAMUSCULAR | Status: DC
  Administered 2022-12-24: 8 [IU] via SUBCUTANEOUS
  Administered 2022-12-24: 11 [IU] via SUBCUTANEOUS

## 2022-12-24 MED FILL — Lidocaine HCl Local Preservative Free (PF) Inj 1%: INTRAMUSCULAR | Qty: 30 | Status: AC

## 2022-12-24 SURGICAL SUPPLY — 7 items
CABLE ADAPT PACING TEMP 12FT (ADAPTER) IMPLANT
ELECT DEFIB PAD ADLT CADENCE (PAD) IMPLANT
KIT MICROPUNCTURE NIT STIFF (SHEATH) IMPLANT
SHEATH PINNACLE 6F 10CM (SHEATH) IMPLANT
SHEATH PROBE COVER 6X72 (BAG) IMPLANT
SLEEVE REPOSITIONING LENGTH 30 (MISCELLANEOUS) IMPLANT
WIRE PACING TEMP ST TIP 5 (CATHETERS) IMPLANT

## 2022-12-24 NOTE — H&P (Signed)
Cardiology History & Physical    Patient ID: Ryan Petersen MRN: [id number], DOB/AGE: 10/16/1965   Admit date: 12/23/2022  Primary Physician: System, Provider Not In Primary Cardiologist: None  Patient Profile    Ryan Petersen is a 57 year old male with a history of type 2 DM, hypertension, hyperlipidemia, presenting with chest pain for the last 48 hours and found to have an inferior STEMI.   History of Present Illness    States he was a few minutes into playing basketball around noon on Saturday (its now Monday night) and began having sudden onset substernal chest pain that has been constant since then. He immediately stopped playing and has been sedentary since then. His wife and daughter ultimately convinced him to come to the ED for evaluation. He initially presented to [facility] and was found to have inferior ST elevation on ECG with mild ongoing chest pain. He does endorse mild associated dyspnea with onset, but this resolved with rest. He admits to poor medication adherence.   Taken emergently to cath lab and found to have occluded mid-RCA with developing left to right collaterals. Ventriculogram showed basal to mid inferior akinesis. Given delayed presentation, it was felt that there was no benefit for revascularization.   Past Medical History   type 2 DM, hypertension, hyperlipidemia  Allergies No Known Allergies  Home Medications    Prior to Admission medications   Medication Sig Start Date End Date Taking? Authorizing Provider  cyclobenzaprine (FLEXERIL) 10 MG tablet Take 1 tablet (10 mg total) by mouth 3 (three) times daily as needed for muscle spasms (or pain). 10/15/15   Trixie Dredge, PA-C  diazepam (VALIUM) 5 MG tablet Take 1 tablet (5 mg total) by mouth every 12 (twelve) hours as needed for muscle spasms (spasms). 10/15/15   Trixie Dredge, PA-C  ibuprofen (ADVIL,MOTRIN) 800 MG tablet Take 1 tablet (800 mg total) by mouth every 8 (eight) hours as needed for mild pain or moderate  pain. 10/15/15   Trixie Dredge, PA-C    Family History    No family history on file. has no family status information on file.    Social History    Social History   Socioeconomic History   Marital status: Married    Spouse name: Not on file   Number of children: Not on file   Years of education: Not on file   Highest education level: Not on file  Occupational History   Not on file  Tobacco Use   Smoking status: Never   Smokeless tobacco: Not on file  Substance and Sexual Activity   Alcohol use: No   Drug use: No   Sexual activity: Not on file  Other Topics Concern   Not on file  Social History Narrative   Not on file   Social Determinants of Health   Financial Resource Strain: Not on file  Food Insecurity: Not on file  Transportation Needs: Not on file  Physical Activity: Not on file  Stress: Not on file  Social Connections: Not on file  Intimate Partner Violence: Not on file     Review of Systems    A comprehensive review of systems was obtained with pertinent positive an negative findings noted in the HPI.  Physical Exam    BP 128/84   Pulse (!) 125   Temp 99.1 F (37.3 C)   Resp (!) 22   Ht  (1.778 m)   Wt 87.1 kg   SpO2 99%   BMI 27.55  kg/m  General: Alert, NAD HEENT: Normal  Neck: No bruits or JVD. Lungs:  Resp regular and unlabored, CTA bilaterally. Heart: Regular rhythm, no s3, s4, or murmurs. Abdomen: Soft, non-tender, non-distended, BS +.  Extremities: Warm. No clubbing, cyanosis or edema. DP/PT/Radials 2+ and equal bilaterally. Psych: Normal affect. Neuro: Alert and oriented. No gross focal deficits. No abnormal movements.  Labs    Troponin (Point of Care Test) No results for input(s): "TROPIPOC" in the last 72 hours. No results for input(s): "CKTOTAL", "CKMB", "TROPONINI" in the last 72 hours. Lab Results  Component Value Date   WBC 13.1 (H) 12/23/2022   HGB 13.1 12/23/2022   HCT 38.7 (L) 12/23/2022   MCV 86.2 12/23/2022    PLT 284 12/23/2022    Recent Labs  Lab 12/23/22 2210  NA 132*  K 3.8  CL 96*  CO2 25  BUN 25*  CREATININE 1.30*  CALCIUM 8.5*  PROT 7.8  BILITOT 1.3*  ALKPHOS 92  ALT 28  AST 53*  GLUCOSE 342*   Lab Results  Component Value Date   CHOL PENDING 12/23/2022   HDL PENDING 12/23/2022   LDLCALC NOT CALCULATED 12/23/2022   TRIG PENDING 12/23/2022   No results found for: "DDIMER"   Radiology Studies    CARDIAC CATHETERIZATION  Result Date: 12/23/2022   1st Mrg lesion is 70% stenosed.   Prox RCA to Dist RCA lesion is 100% stenosed.   There is mild left ventricular systolic dysfunction.   LV end diastolic pressure is normal.   The left ventricular ejection fraction is 50-55% by visual estimate. 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 Minimal left to right collaterals. Mild LV dysfunction with basal to mid inferior akinesis Normal LVEDP Plan; patient presents with more than 48 hours of continuous chest pain. Now with minimal chest pressure. Findings c/w completed inferior infarct. I don't think there is an indication for reperfusion at this time. Will manage medically.   DG Chest Port 1 View  Result Date: 12/23/2022 CLINICAL DATA:  Stemi CP since Friday and worse since Saturday especially since playing basketball - got SOB and weak and cannot take a full breath. EXAM: PORTABLE CHEST 1 VIEW COMPARISON:  None Available. FINDINGS: Cardiac paddles overlie the chest. Enlarged cardiac silhouette. Otherwise the heart and mediastinal contours are within normal limits. No focal consolidation. No pulmonary edema. No pleural effusion. No pneumothorax. No acute osseous abnormality. IMPRESSION: 1. No active disease. 2. Enlarged cardiac silhouette with some component likely due to portable AP technique. Electronically Signed   By: Tish Frederickson M.D.   On: 12/23/2022 22:37    ECG & Cardiac Imaging    ECG: sinus tachycardia, inferior STEMI - personally reviewed.  Assessment & Plan     Inferior STEMI: late-presenting with likely largely completed infarct. He has some residual chest pain but not felt to warrant revascularization. No evidence of mechanical complication or heart failure, fortunately. Will manage medically. - TTE, A1c, lipid profile, serial troponins - Load with Plavix ; DAPT for 1 year - Heparin for 48 hrs - Nitroglycerin infusion for chest pain - Start atorvastatin  - No beta blocker due to AV block  2:1 AV block: - hold on beta blocker - Monitor for now  Type 2 DM with hyperglycemia: - A1c - Basal-bolus insulin - Would likely be a good candidate for GLP-1 and/or SGLT2-i  Renal insufficiency: baseline Cr seems to be around 1.1 to 1.2.  - IVF ordered - Recheck in AM  Hypertension: - BP improved - Consider restarting home meds in AM  Nutrition: carb modified DVT ppx: UFH infusion Advanced Care Planning: Full Code  Signed, Clerance Lav, MD 12/24/2022, 12:48 AM

## 2022-12-24 NOTE — Progress Notes (Signed)
  Echocardiogram 2D Echocardiogram has been performed.  Ryan Petersen 12/24/2022, 1:49 PM

## 2022-12-24 NOTE — TOC CM/SW Note (Signed)
..   Transition of Care [facility]) Screening Note   Patient Details  Name: Eugene Isadore Date of Birth: 03-25-1966   Transition of Care [facility]) CM/SW Contact:    Elliot Cousin, RN Phone Number: [phone] 12/24/2022, 6:47 PM    Transition of Care Department [facility]) has reviewed patient. We will continue to monitor patient advancement through interdisciplinary progression rounds. Will continue to follow for dc needs.   Chart reviewed

## 2022-12-24 NOTE — Progress Notes (Signed)
 Rounding Note    Patient Name: Ryan Petersen Date of Encounter: 12/24/2022   [facility] Cardiologist: Jordan  Subjective   Late presentation STEMI with occluded mid RCA not intervened upon yesterday.  Overnight, patient has developed second degree mobitz I block.  Blood sugars remain elevated.  Continues to have some low level chest discomfort.     Inpatient Medications    Scheduled Meds:  aspirin  81 mg Oral Daily   atorvastatin  80 mg Oral Daily   Chlorhexidine Gluconate Cloth  6 each Topical Daily   [START ON 12/25/2022] clopidogrel  75 mg Oral Daily   gabapentin  900 mg Oral TID   insulin aspart  0-15 Units Subcutaneous TID WC   insulin aspart  0-5 Units Subcutaneous QHS   insulin aspart  4 Units Subcutaneous TID WC   insulin detemir  15 Units Subcutaneous Daily   sodium chloride flush  3 mL Intravenous Q12H   Continuous Infusions:  sodium chloride Stopped (12/24/22 0730)   sodium chloride     sodium chloride 1 mL/kg/hr (12/24/22 0800)   heparin 1,200 Units/hr (12/24/22 0800)   nitroGLYCERIN 20 mcg/min (12/24/22 0800)   PRN Meds: sodium chloride, acetaminophen, ondansetron (ZOFRAN) IV, mouth rinse, sodium chloride flush, traMADol   Vital Signs    Vitals:   12/24/22 0645 12/24/22 0700 12/24/22 0751 12/24/22 0800  BP: 105/67 105/76  106/75  Pulse: (!) 59 64  (!) 54  Resp: 10 18  (!) 8  Temp:   98.3 F (36.8 C)   TempSrc:   Oral   SpO2: 99% 97%  95%  Weight:      Height:        Intake/Output Summary (Last 24 hours) at 12/24/2022 0907 Last data filed at 12/24/2022 0800 Gross per 24 hour  Intake 1144.62 ml  Output 400 ml  Net 744.62 ml      12/23/2022   10:01 PM 10/15/2015   10:26 AM  Last 3 Weights  Weight (lbs) 192 lb 205 lb  Weight (kg) 87.091 kg 92.987 kg      Telemetry    Second degree Mobitz I - Personally Reviewed  ECG    2:1 AV block- Personally Reviewed  Physical Exam   GEN: No acute distress.   Neck: No JVD Cardiac:  RRR, no murmurs, rubs, or gallops.  Respiratory: Clear to auscultation bilaterally. GI: Soft, nontender, non-distended  MS: No edema; No deformity. Neuro:  Nonfocal  Psych: Normal affect   Labs    High Sensitivity Troponin:   Recent Labs  Lab 12/23/22 2207 12/24/22 0047  TROPONINIHS 4,852* 4,524*     Chemistry Recent Labs  Lab 12/23/22 2210 12/24/22 0047  NA 132* 133*  K 3.8 3.9  CL 96* 95*  CO2 25 23  GLUCOSE 342* 358*  BUN 25* 23*  CREATININE 1.30* 1.41*  CALCIUM 8.5* 8.6*  MG  --  2.0  PROT 7.8  --   ALBUMIN 3.8  --   AST 53*  --   ALT 28  --   ALKPHOS 92  --   BILITOT 1.3*  --   GFRNONAA >60 58*  ANIONGAP 11 15    Lipids  Recent Labs  Lab 12/23/22 2210  CHOL PENDING  TRIG PENDING  HDL PENDING  LDLCALC NOT CALCULATED  CHOLHDL PENDING    Hematology Recent Labs  Lab 12/23/22 2210  WBC 13.1*  RBC 4.49  HGB 13.1  HCT 38.7*  MCV 86.2  MCH 29.2  MCHC   33.9  RDW 12.5  PLT 284   Thyroid No results for input(s): "TSH", "FREET4" in the last 168 hours.  BNPNo results for input(s): "BNP", "PROBNP" in the last 168 hours.  DDimer No results for input(s): "DDIMER" in the last 168 hours.   Radiology    CARDIAC CATHETERIZATION  Result Date: 12/23/2022   1st Mrg lesion is 70% stenosed.   Prox RCA to Dist RCA lesion is 100% stenosed.   There is mild left ventricular systolic dysfunction.   LV end diastolic pressure is normal.   The left ventricular ejection fraction is 50-55% by visual estimate. 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 Minimal left to right collaterals. Mild LV dysfunction with basal to mid inferior akinesis Normal LVEDP Plan; patient presents with more than 48 hours of continuous chest pain. Now with minimal chest pressure. Findings c/w completed inferior infarct. I don't think there is an indication for reperfusion at this time. Will manage medically.   DG Chest Port 1 View  Result Date: 12/23/2022 CLINICAL DATA:  Stemi CP since  Friday and worse since Saturday especially since playing basketball - got SOB and weak and cannot take a full breath. EXAM: PORTABLE CHEST 1 VIEW COMPARISON:  None Available. FINDINGS: Cardiac paddles overlie the chest. Enlarged cardiac silhouette. Otherwise the heart and mediastinal contours are within normal limits. No focal consolidation. No pulmonary edema. No pleural effusion. No pneumothorax. No acute osseous abnormality. IMPRESSION: 1. No active disease. 2. Enlarged cardiac silhouette with some component likely due to portable AP technique. Electronically Signed   By: Morgane  Naveau M.D.   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 Minimal left to right collaterals. Mild LV dysfunction with basal to mid inferior akinesis Normal LVEDP  Patient Profile     57 y.o. male T2DM (poorly controlled), medical noncompliance, HTN, HL here with late presentation inferior STEMI (no PCI) now with intermittent 2:1 AV block  Assessment & Plan    Late presentation STEMI: PCI deferred due to late presentation.  Continue Plavix, aspirin, statin and heparin drip for 48 hours.  Obtain echocardiogram.  Given intermittent 2-1 AV block will hold beta-blockers.  Stop nitroglycerin drip and will treat residual chest pain with as needed IV morphine. 2-1 AV block: The patient demonstrates intermittent second-degree Mobitz type I as well as intermittent 2-1 AV block.  Will refer for temporary pacemaker placement from right IJ approach.  I discussed with Dr. Cooper will do the procedure.  I have also consulted EP.  Patient understands issues and risks of procedure and agrees with plan. Hypertension: DC nitroglycerin drip and will optimize regimen as informed by conduction issues and echocardiogram. Hyperlipidemia: Continue high-dose atorvastatin Type 2 diabetes: Discussed with pharmacy; we are intensifying the patient's regimen.  Per patient  last hemoglobin A1c was over 10.  Will likely need insulin long-term.  For questions or updates, please contact [facility] Please consult [email or web address] for contact info under    CRITICAL CARE Performed by: Tarrance Januszewski K Cavon Nicolls   Total critical care time: 30 minutes  Critical care time was exclusive of separately billable procedures and treating other patients.  Critical care was necessary to treat or prevent imminent or life-threatening deterioration.  Critical care was time spent personally by me on the following activities: development of treatment plan with patient and/or surrogate as well as nursing, discussions with consultants, evaluation of patient's response to treatment, examination of   patient, obtaining history from patient or surrogate, ordering and performing treatments and interventions, ordering and review of laboratory studies, ordering and review of radiographic studies, pulse oximetry and re-evaluation of patient's condition.     Signed, Luberta Grabinski K Denessa Cavan, MD  12/24/2022, 9:07 AM    

## 2022-12-24 NOTE — Interval H&P Note (Signed)
History and Physical Interval Note:  12/24/2022 10:07 AM  Ryan Petersen  has presented today for surgery, with the diagnosis of heart block.  The various methods of treatment have been discussed with the patient and family. After consideration of risks, benefits and other options for treatment, the patient has consented to  Procedure(s): TEMPORARY PACEMAKER (N/A) as a surgical intervention.  The patient's history has been reviewed, patient examined, no change in status, stable for surgery.  I have reviewed the patient's chart and labs.  Questions were answered to the patient's satisfaction.     Tonny Bollman

## 2022-12-24 NOTE — H&P (View-Only) (Signed)
Rounding Note    Patient Name: Ryan Petersen Date of Encounter: 12/24/2022  [facility] Cardiologist: [place]  Subjective   Late presentation STEMI with occluded mid RCA not intervened upon yesterday.  Overnight, patient has developed second degree mobitz I block.  Blood sugars remain elevated.  Continues to have some low level chest discomfort.     Inpatient Medications    Scheduled Meds:  aspirin  81 mg Oral Daily   atorvastatin  80 mg Oral Daily   Chlorhexidine Gluconate Cloth  6 each Topical Daily   [START ON 12/25/2022] clopidogrel  75 mg Oral Daily   gabapentin  900 mg Oral TID   insulin aspart  0-15 Units Subcutaneous TID WC   insulin aspart  0-5 Units Subcutaneous QHS   insulin aspart  4 Units Subcutaneous TID WC   insulin detemir  15 Units Subcutaneous Daily   sodium chloride flush  3 mL Intravenous Q12H   Continuous Infusions:  sodium chloride Stopped (12/24/22 0730)   sodium chloride     sodium chloride 1 mL/kg/hr (12/24/22 0800)   heparin 1,200 Units/hr (12/24/22 0800)   nitroGLYCERIN 20 mcg/min (12/24/22 0800)   PRN Meds: sodium chloride, acetaminophen, ondansetron (ZOFRAN) IV, mouth rinse, sodium chloride flush, traMADol   Vital Signs    Vitals:   12/24/22 0645 12/24/22 0700 12/24/22 0751 12/24/22 0800  BP: 105/67 105/76  106/75  Pulse: (!) 59 64  (!) 54  Resp: 10 18  (!) 8  Temp:   98.3 F (36.8 C)   TempSrc:   Oral   SpO2: 99% 97%  95%  Weight:      Height:        Intake/Output Summary (Last 24 hours) at 12/24/2022 0907 Last data filed at 12/24/2022 0800 Gross per 24 hour  Intake 1144.62 ml  Output 400 ml  Net 744.62 ml      12/23/2022   10:01 PM 10/15/2015   10:26 AM  Last 3 Weights  Weight (lbs) 192 lb 205 lb  Weight (kg) 87.091 kg 92.987 kg      Telemetry    Second degree Mobitz I - Personally Reviewed  ECG    2:1 AV block- Personally Reviewed  Physical Exam   GEN: No acute distress.   Neck: No JVD Cardiac:  RRR, no murmurs, rubs, or gallops.  Respiratory: Clear to auscultation bilaterally. GI: Soft, nontender, non-distended  MS: No edema; No deformity. Neuro:  Nonfocal  Psych: Normal affect   Labs    High Sensitivity Troponin:   Recent Labs  Lab 12/23/22 2207 12/24/22 0047  TROPONINIHS 4,852* 4,524*     Chemistry Recent Labs  Lab 12/23/22 2210 12/24/22 0047  NA 132* 133*  K 3.8 3.9  CL 96* 95*  CO2 25 23  GLUCOSE 342* 358*  BUN 25* 23*  CREATININE 1.30* 1.41*  CALCIUM 8.5* 8.6*  MG  --  2.0  PROT 7.8  --   ALBUMIN 3.8  --   AST 53*  --   ALT 28  --   ALKPHOS 92  --   BILITOT 1.3*  --   GFRNONAA >60 58*  ANIONGAP 11 15    Lipids  Recent Labs  Lab 12/23/22 2210  CHOL PENDING  TRIG PENDING  HDL PENDING  LDLCALC NOT CALCULATED  CHOLHDL PENDING    Hematology Recent Labs  Lab 12/23/22 2210  WBC 13.1*  RBC 4.49  HGB 13.1  HCT 38.7*  MCV 86.2  MCH 29.2  MCHC  33.9  RDW 12.5  PLT 284   Thyroid No results for input(s): "TSH", "FREET4" in the last 168 hours.  BNPNo results for input(s): "BNP", "PROBNP" in the last 168 hours.  DDimer No results for input(s): "DDIMER" in the last 168 hours.   Radiology    CARDIAC CATHETERIZATION  Result Date: 12/23/2022   1st Mrg lesion is 70% stenosed.   Prox RCA to Dist RCA lesion is 100% stenosed.   There is mild left ventricular systolic dysfunction.   LV end diastolic pressure is normal.   The left ventricular ejection fraction is 50-55% by visual estimate. 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 Minimal left to right collaterals. Mild LV dysfunction with basal to mid inferior akinesis Normal LVEDP Plan; patient presents with more than 48 hours of continuous chest pain. Now with minimal chest pressure. Findings c/w completed inferior infarct. I don't think there is an indication for reperfusion at this time. Will manage medically.   DG Chest Port 1 View  Result Date: 12/23/2022 CLINICAL DATA:  Stemi CP since  Friday and worse since Saturday especially since playing basketball - got SOB and weak and cannot take a full breath. EXAM: PORTABLE CHEST 1 VIEW COMPARISON:  None Available. FINDINGS: Cardiac paddles overlie the chest. Enlarged cardiac silhouette. Otherwise the heart and mediastinal contours are within normal limits. No focal consolidation. No pulmonary edema. No pleural effusion. No pneumothorax. No acute osseous abnormality. IMPRESSION: 1. No active disease. 2. Enlarged cardiac silhouette with some component likely due to portable AP technique. Electronically Signed   By: Tish Frederickson M.D.   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 Minimal left to right collaterals. Mild LV dysfunction with basal to mid inferior akinesis Normal LVEDP  Patient Profile     57 y.o. male T2DM (poorly controlled), medical noncompliance, HTN, HL here with late presentation inferior STEMI (no PCI) now with intermittent 2:1 AV block  Assessment & Plan    Late presentation STEMI: PCI deferred due to late presentation.  Continue Plavix, aspirin, statin and heparin drip for 48 hours.  Obtain echocardiogram.  Given intermittent 2-1 AV block will hold beta-blockers.  Stop nitroglycerin drip and will treat residual chest pain with as needed IV morphine. 2-1 AV block: The patient demonstrates intermittent second-degree Mobitz type I as well as intermittent 2-1 AV block.  Will refer for temporary pacemaker placement from right IJ approach.  I discussed with Dr. Excell Seltzer will do the procedure.  I have also consulted EP.  Patient understands issues and risks of procedure and agrees with plan. Hypertension: DC nitroglycerin drip and will optimize regimen as informed by conduction issues and echocardiogram. Hyperlipidemia: Continue high-dose atorvastatin Type 2 diabetes: Discussed with pharmacy; we are intensifying the patient's regimen.  Per patient  last hemoglobin A1c was over 10.  Will likely need insulin long-term.  For questions or updates, please contact [facility] Please consult [email or web address] for contact info under    CRITICAL CARE Performed by: Orbie Pyo   Total critical care time: 30 minutes  Critical care time was exclusive of separately billable procedures and treating other patients.  Critical care was necessary to treat or prevent imminent or life-threatening deterioration.  Critical care was time spent personally by me on the following activities: development of treatment plan with patient and/or surrogate as well as nursing, discussions with consultants, evaluation of patient's response to treatment, examination of  patient, obtaining history from patient or surrogate, ordering and performing treatments and interventions, ordering and review of laboratory studies, ordering and review of radiographic studies, pulse oximetry and re-evaluation of patient's condition.     Signed, Orbie Pyo, MD  12/24/2022, 9:07 AM

## 2022-12-24 NOTE — Progress Notes (Signed)
ANTICOAGULATION CONSULT NOTE - Initial Consult  Pharmacy Consult for Heparin Indication: chest pain/ACS  Allergies  Allergen Reactions   Doxycycline Rash    Patient Measurements: Height:  (177.8 cm) Weight: 87.1 kg (192 lb) IBW/kg (Calculated) : 73 Heparin Dosing Weight: 87.1 kg  Vital Signs: Temp: 98.2 F (36.8 C) (04/16 1615) Temp Source: Oral (04/16 1615) BP: 105/81 (04/16 1500) Pulse Rate: 73 (04/16 1500)  Labs: Recent Labs    12/23/22 2207 12/23/22 2210 12/24/22 0047 12/24/22 1611  HGB  --  13.1  --   --   HCT  --  38.7*  --   --   PLT  --  284  --   --   APTT  --  30  --   --   LABPROT  --  14.6  --   --   INR  --  1.2  --   --   HEPARINUNFRC  --   --   --  <0.10*  CREATININE  --  1.30* 1.41*  --   TROPONINIHS 4,852*  --  4,524*  --      Estimated Creatinine Clearance: 59 mL/min (A) (by C-G formula based on SCr of 1.41 mg/dL (H)).   Medical History: No past medical history on file.  Medications:  Medications Prior to Admission  Medication Sig Dispense Refill Last Dose   gabapentin (NEURONTIN) 300 MG capsule Take 900 mg by mouth 3 (three) times daily.   12/23/2022 at am   traMADol (ULTRAM) 50 MG tablet Take 100 mg by mouth every 6 (six) hours as needed (for back pain).   12/23/2022 at am   carvedilol (COREG) 6.25 MG tablet Take 3.125 mg by mouth 2 (two) times daily with a meal. (Patient not taking: Reported on 12/24/2022)   Not Taking at pm    Scheduled:   aspirin  81 mg Oral Daily   atorvastatin  80 mg Oral Daily   Chlorhexidine Gluconate Cloth  6 each Topical Daily   [START ON 12/25/2022] clopidogrel  75 mg Oral Daily   colchicine  0.6 mg Oral BID   gabapentin  600 mg Oral Q12H   insulin aspart  0-20 Units Subcutaneous TID WC   insulin aspart  0-5 Units Subcutaneous QHS   insulin aspart  4 Units Subcutaneous TID WC   insulin detemir  15 Units Subcutaneous Daily   isosorbide mononitrate  15 mg Oral Daily   sodium chloride flush  3 mL  Intravenous Q12H   Infusions:   sodium chloride Stopped (12/24/22 0730)   sodium chloride     heparin 1,200 Units/hr (12/24/22 0900)   PRN: sodium chloride, acetaminophen, morphine injection, ondansetron (ZOFRAN) IV, mouth rinse, sodium chloride flush, traMADol  Assessment: 56 yom with a history of HTN. Patient is presenting with chest pain. Code STEMI activated. Heparin per pharmacy consult placed for chest pain/ACS. Patient is not on anticoagulation prior to arrival.   Plan for 48hr of IV heparin for late presentation STEMI. Last CBC (4/15) WNL  HL <0.10 at 1200 units/hr - subtherapeutic   Goal of Therapy:  Heparin level 0.3-0.7 units/ml Monitor platelets by anticoagulation protocol: Yes   Plan:  Heparin bolus 2600 units IV x 1  Then increase heparin infusion rate to 1500 units/hr Check anti-Xa level in 6-8 hours and daily while on heparin Continue to monitor H&H and platelets  Calton Dach, PharmD Clinical Pharmacist 12/24/2022 5:02 PM

## 2022-12-24 NOTE — Progress Notes (Signed)
    Called to bedside regarding patient complaint of ongoing chest pain. Reports this is the same pain that brought him into the ED. Sharp pleuritic type pain. 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 S/p temp wire from this morning as well. BP improvement in the 110s systolic.  -- check Sed rate, CRP -- start colchicine 0.6mg  BID, add Imdur  daily -- continue IV morphine for now, ideally DC if improvement with colchicine -- touched base with echo to complete soon, ensure no pericardial effusion  Signed, Laverda Page, NP-C 12/24/2022, 12:13 PM Pager: [phone]

## 2022-12-24 NOTE — Consult Note (Addendum)
Cardiology Consultation   Patient ID: Ryan Petersen MRN: [id number]; DOB: [birth year]  Admit date: 12/23/2022 Date of Consult: 12/24/2022  PCP:  Ryan Grippe, MD   [facility] Providers Cardiologist:  None      Patient Profile:   Ryan Petersen is a 57 y.o. male with a hx of HTN, DM, HLD who is being seen 12/24/2022 for the evaluation of heart block at the request of Dr. Lynnette Petersen.  History of Present Illness:   Ryan Petersen presented last night to [facility] ER with about 48 hours of CP >> [facility] with STEMI brought emergently to the cath lab found to have occluded mid-RCA with developing left to right collaterals. Ventriculogram showed basal to mid inferior akinesis. Given delayed presentation, it was felt that there was no benefit for revascularization.   His home coreg held with development of variable heart block  EP is asked on board, this AM with intermittent 2:1 conduction had temp pacing wire placed  LABS K+ 3.8, 3.9Mag 2.0 BUN/Creat 25/1.30 > 23/1.41 HS Trop 4852 > 4524 WBC 13.1 H/H 13/38 Plts 284  Eating, family is at bedside He is having recurrent CP, he says more so when taking a dep breath in (same as before, not new since temp wire) Morphine helped it yesterday, the NTG gtt did well, but since off has started back up.    Past Surgical History:  Procedure Laterality Date   CORONARY/GRAFT ACUTE MI REVASCULARIZATION N/A 12/23/2022   Procedure: Coronary/Graft Acute MI Revascularization;  Surgeon: Petersen, Ryan M, MD;  Location: [facility] INVASIVE CV LAB;  Service: Cardiovascular;  Laterality: N/A;   LEFT HEART CATH AND CORONARY ANGIOGRAPHY N/A 12/23/2022   Procedure: LEFT HEART CATH AND CORONARY ANGIOGRAPHY;  Surgeon: Petersen, Ryan M, MD;  Location: [facility] INVASIVE CV LAB;  Service: Cardiovascular;  Laterality: N/A;   TEMPORARY PACEMAKER N/A 12/24/2022   Procedure: TEMPORARY PACEMAKER;  Surgeon: Tonny Bollman, MD;  Location: [facility] INVASIVE CV LAB;  Service: Cardiovascular;   Laterality: N/A;     Home Medications:  Prior to Admission medications   Medication Sig Start Date End Date Taking? Authorizing Provider  gabapentin (NEURONTIN) 300 MG capsule Take 900 mg by mouth 3 (three) times daily. 10/27/19  Yes [provider]  traMADol (ULTRAM) 50 MG tablet Take 100 mg by mouth every 6 (six) hours as needed (for back pain). 10/27/19  Yes [provider]  carvedilol (COREG) 6.25 MG tablet Take 3.125 mg by mouth 2 (two) times daily with a meal. Patient not taking: Reported on 12/24/2022 11/19/22   [provider]    Inpatient Medications: Scheduled Meds:  aspirin  81 mg Oral Daily   atorvastatin  80 mg Oral Daily   Chlorhexidine Gluconate Cloth  6 each Topical Daily   [START ON 12/25/2022] clopidogrel  75 mg Oral Daily   gabapentin  900 mg Oral TID   insulin aspart  0-15 Units Subcutaneous TID WC   insulin aspart  0-5 Units Subcutaneous QHS   insulin aspart  4 Units Subcutaneous TID WC   insulin detemir  15 Units Subcutaneous Daily   sodium chloride flush  3 mL Intravenous Q12H   Continuous Infusions:  sodium chloride Stopped (12/24/22 0730)   sodium chloride     heparin 1,200 Units/hr (12/24/22 0800)   PRN Meds: sodium chloride, acetaminophen, morphine injection, ondansetron (ZOFRAN) IV, mouth rinse, sodium chloride flush, traMADol  Allergies:    Allergies  Allergen Reactions   Doxycycline Rash    Social History:  Social History   Socioeconomic History   Marital status: Married    Spouse name: Not on file   Number of children: Not on file   Years of education: Not on file   Highest education level: Not on file  Occupational History   Not on file  Tobacco Use   Smoking status: Never   Smokeless tobacco: Not on file  Substance and Sexual Activity   Alcohol use: No   Drug use: No   Sexual activity: Not on file  Other Topics Concern   Not on file  Social History Narrative   Not on file   Social Determinants of  Health   Financial Resource Strain: Not on file  Food Insecurity: Not on file  Transportation Needs: Not on file  Physical Activity: Not on file  Stress: Not on file  Social Connections: Not on file  Intimate Partner Violence: Not on file    Family History:   Mom died of heart disease Father had DM, severe PVD, died of PNA, older brother DM/heart disease, younger brother with unclear heart issue as well  ROS:  Please see the history of present illness.  All other ROS reviewed and negative.     Physical Exam/Data:   Vitals:   12/24/22 1032 12/24/22 1034 12/24/22 1036 12/24/22 1104  BP: (!) 80/63 (!) 78/62 (!) 81/60   Pulse: (!) 59 (!) 45 (!) 49   Resp:      Temp:    97.9 F (36.6 C)  TempSrc:    Oral  SpO2: 98% 98%    Weight:      Height:        Intake/Output Summary (Last 24 hours) at 12/24/2022 1129 Last data filed at 12/24/2022 0800 Gross per 24 hour  Intake 1144.62 ml  Output 400 ml  Net 744.62 ml      12/23/2022   10:01 PM 10/15/2015   10:26 AM  Last 3 Weights  Weight (lbs) 192 lb 205 lb  Weight (kg) 87.091 kg 92.987 kg     Body mass index is 27.55 kg/m.  General:  Well nourished, well developed, in no acute distress, voices CP HEENT: normal Neck: no JVD Vascular: No carotid bruits; Distal pulses 2+ bilaterally Cardiac:  RRR; no murmurs, gallops or rubs Lungs:  CTA b/l, no wheezing, rhonchi or rales  Abd: soft, nontender, no hepatomegaly  Ext: no edema Musculoskeletal:  No deformities Skin: warm and dry  Neuro: no focal abnormalities noted Psych:  Normal affect   EKG:  The EKG was personally reviewed and demonstrates:   ST 123bpm, ST elevation II, III, aVF 2:1 AVblock 56bpm, , narrow QRS  Telemetry:  Telemetry was personally reviewed and demonstrates:   SR with 1:1, mobitz one and 2:1 conduction rates 60's  Relevant CV Studies:   12/23/22: LHC 1st Mrg lesion is 70% stenosed.   Prox RCA to Dist RCA lesion is 100% stenosed.   There is mild left  ventricular systolic dysfunction.   LV end diastolic pressure is normal.   The left ventricular ejection fraction is 50-55% by visual estimate.   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 Minimal left to right collaterals. Mild LV dysfunction with basal to mid inferior akinesis Normal LVEDP   Plan; patient presents with more than 48 hours of continuous chest pain. Now with minimal chest pressure. Findings c/w completed inferior infarct. I don't think there is an indication for reperfusion at this time. Will manage medically.    Laboratory  Data:  High Sensitivity Troponin:   Recent Labs  Lab 12/23/22 2207 12/24/22 0047  TROPONINIHS 4,852* 4,524*     Chemistry Recent Labs  Lab 12/23/22 2210 12/24/22 0047  NA 132* 133*  K 3.8 3.9  CL 96* 95*  CO2 25 23  GLUCOSE 342* 358*  BUN 25* 23*  CREATININE 1.30* 1.41*  CALCIUM 8.5* 8.6*  MG  --  2.0  GFRNONAA >60 58*  ANIONGAP 11 15    Recent Labs  Lab 12/23/22 2210  PROT 7.8  ALBUMIN 3.8  AST 53*  ALT 28  ALKPHOS 92  BILITOT 1.3*   Lipids  Recent Labs  Lab 12/23/22 2210  CHOL PENDING  TRIG PENDING  HDL PENDING  LDLCALC NOT CALCULATED  CHOLHDL PENDING    Hematology Recent Labs  Lab 12/23/22 2210  WBC 13.1*  RBC 4.49  HGB 13.1  HCT 38.7*  MCV 86.2  MCH 29.2  MCHC 33.9  RDW 12.5  PLT 284   Thyroid No results for input(s): "TSH", "FREET4" in the last 168 hours.  BNPNo results for input(s): "BNP", "PROBNP" in the last 168 hours.  DDimer No results for input(s): "DDIMER" in the last 168 hours.   Radiology/Studies:   DG Chest Port 1 View Result Date: 12/23/2022 CLINICAL DATA:  Stemi CP since Friday and worse since Saturday especially since playing basketball - got SOB and weak and cannot take a full breath. EXAM: PORTABLE CHEST 1 VIEW COMPARISON:  None Available. FINDINGS: Cardiac paddles overlie the chest. Enlarged cardiac silhouette. Otherwise the heart and mediastinal contours are within normal  limits. No focal consolidation. No pulmonary edema. No pleural effusion. No pneumothorax. No acute osseous abnormality. IMPRESSION: 1. No active disease. 2. Enlarged cardiac silhouette with some component likely due to portable AP technique. Electronically Signed   By: Tish Frederickson M.D.   On: 12/23/2022 22:37     Assessment and Plan:   Variable heart block Mobitz one and 2:1 conduction S/p temp wire  S/p late presenting STEMI Cath with occl RCA > not intervened on given with more than 48 hours of continuous chest pain. Now with minimal chest pressure. Findings c/w completed inferior infarct.  Avoid nodal blocking agents Temp wire in  Follow conduction as his clinical course evolves  2. STEMI came in last night  Off NTG gtt this AM Having some more CP, pleuritic sounding, but similar/same as his presenting pain Cards team NP at bedside EKG ordered  Echo pending   Risk Assessment/Risk Scores:     For questions or updates, please contact [facility] Please consult [email or web address] for contact info under    Signed, Sheilah Pigeon, PA-C  12/24/2022 11:29 AM

## 2022-12-24 NOTE — Progress Notes (Addendum)
ANTICOAGULATION CONSULT NOTE - Follow Up Consult  Pharmacy Consult for Heparin Indication: chest pain/ACS  No Known Allergies  Patient Measurements: Height:  (177.8 cm) Weight: 87.1 kg (192 lb) IBW/kg (Calculated) : 73 Heparin Dosing Weight: 87.1 kg  Vital Signs: Temp: 99.1 F (37.3 C) (04/15 2159) BP: 128/84 (04/15 2230) Pulse Rate: 125 (04/15 2230)  Labs: Recent Labs    12/23/22 2207 12/23/22 2210  HGB  --  13.1  HCT  --  38.7*  PLT  --  284  APTT  --  30  LABPROT  --  14.6  INR  --  1.2  CREATININE  --  1.30*  TROPONINIHS 4,852*  --     Estimated Creatinine Clearance: 64 mL/min (A) (by C-G formula based on SCr of 1.3 mg/dL (H)).   Assessment: 58 yom with a history of HTN presented with chest pain on 4/15. Code STEMI activated.  Patient was not on anticoagulation prior to arrival. [facility] ED gave 4000 unit IV heparin prior to patient's transport to [facility].   Patient was started on Heparin 1200 units/hr continuous infusion. Heparin was stopped for cardiac catheterization.  Baseline aPTT returned WNL. HgB and Platelets stable.    Goal of Therapy:  Heparin level 0.3-0.7 units/ml Monitor platelets by anticoagulation protocol: Yes   Plan:  Restart heparin infusion 1200 units/hr 2 hours post radial band removal  Obtain heparin levels in 8 hours Daily CBC and heparin levels Monitor s/sx of bleeding   Moise Friday D Ardice Boyan 12/24/2022,12:46 AM

## 2022-12-25 DIAGNOSIS — I442 Atrioventricular block, complete: Secondary | ICD-10-CM

## 2022-12-25 LAB — CBC
HCT: 34.6 % — ABNORMAL LOW (ref 39.0–52.0)
Hemoglobin: 11.5 g/dL — ABNORMAL LOW (ref 13.0–17.0)
MCH: 29.2 pg (ref 26.0–34.0)
MCHC: 33.2 g/dL (ref 30.0–36.0)
MCV: 87.8 fL (ref 80.0–100.0)
Platelets: 248 10*3/uL (ref 150–400)
RBC: 3.94 MIL/uL — ABNORMAL LOW (ref 4.22–5.81)
RDW: 12.7 % (ref 11.5–15.5)
WBC: 13 10*3/uL — ABNORMAL HIGH (ref 4.0–10.5)
nRBC: 0 % (ref 0.0–0.2)

## 2022-12-25 LAB — HEPARIN LEVEL (UNFRACTIONATED)
Heparin Unfractionated: 0.27 IU/mL — ABNORMAL LOW (ref 0.30–0.70)
Heparin Unfractionated: 0.31 IU/mL (ref 0.30–0.70)

## 2022-12-25 LAB — GLUCOSE, CAPILLARY
Glucose-Capillary: 138 mg/dL — ABNORMAL HIGH (ref 70–99)
Glucose-Capillary: 196 mg/dL — ABNORMAL HIGH (ref 70–99)
Glucose-Capillary: 204 mg/dL — ABNORMAL HIGH (ref 70–99)
Glucose-Capillary: 161 mg/dL — ABNORMAL HIGH (ref 70–99)

## 2022-12-25 MED ORDER — INSULIN GLARGINE-YFGN 100 UNIT/ML ~~LOC~~ SOLN
15.0000 [IU] | Freq: Every day | SUBCUTANEOUS | Status: DC
  Administered 2022-12-25 – 2022-12-29 (×5): 15 [IU] via SUBCUTANEOUS
  Filled 2022-12-25 (×6): qty 0.15

## 2022-12-25 MED ORDER — EMPAGLIFLOZIN 10 MG PO TABS
10.0000 mg | ORAL_TABLET | Freq: Every day | ORAL | Status: DC
  Administered 2022-12-25 – 2022-12-30 (×6): 10 mg via ORAL
  Filled 2022-12-25 (×6): qty 1

## 2022-12-25 MED ORDER — LOSARTAN POTASSIUM 25 MG PO TABS
12.5000 mg | ORAL_TABLET | Freq: Every day | ORAL | Status: DC
  Administered 2022-12-25 – 2022-12-29 (×5): 12.5 mg via ORAL
  Filled 2022-12-25 (×5): qty 1

## 2022-12-25 NOTE — Progress Notes (Signed)
ANTICOAGULATION CONSULT NOTE - Follow Up Consult  Pharmacy Consult for heparin Indication:  medical management of completed MI  Labs: Recent Labs    12/23/22 2207 12/23/22 2210 12/24/22 0047 12/24/22 1611 12/25/22 0051 12/25/22 1015  HGB  --  13.1  --   --  11.5*  --   HCT  --  38.7*  --   --  34.6*  --   PLT  --  284  --   --  248  --   APTT  --  30  --   --   --   --   LABPROT  --  14.6  --   --   --   --   INR  --  1.2  --   --   --   --   HEPARINUNFRC  --   --   --  <0.10* 0.27* 0.31  CREATININE  --  1.30* 1.41*  --   --   --   TROPONINIHS 1,610*  --  4,524*  --   --   --     Assessment: 57yo male with therapeutic HL of 0.31 on 1600 units/hr after slight rate increase. Hgb relatively stable, but did drop from 13.1 > 11.5 g/dL likely due to intervention and lab draws. No overt bleeding. Plt wnl and stable. Heparin ordered until 4/17 @ 2300 to complete 48 hrs of anticoagulation.   Goal of Therapy:  Heparin level 0.3-0.7 units/ml   Plan:  Continue heparin 1600 units/hr Monitor Hgb, plt tomorrow   Nils Pyle, PharmD Candidate

## 2022-12-25 NOTE — Progress Notes (Signed)
Rounding Note    Patient Name: Ryan Petersen Date of Encounter: 12/25/2022  [facility] Cardiologist: [place]  Subjective   RIJ temp wire placed yesterday due to 2:1 AVB.  Started on colchicine and Imdur for chest pain.  Tele with second degree mobitz 1 block  TTE yesterday with EF 40-45% with mild to moderate RV dysfunction; no significant valve issues.  Pleuritic chest pain somewhat improved, OTW no complaints  Inpatient Medications    Scheduled Meds:  aspirin  81 mg Oral Daily   atorvastatin  80 mg Oral Daily   Chlorhexidine Gluconate Cloth  6 each Topical Daily   clopidogrel  75 mg Oral Daily   colchicine  0.6 mg Oral BID   gabapentin  600 mg Oral Q12H   insulin aspart  0-20 Units Subcutaneous TID WC   insulin aspart  0-5 Units Subcutaneous QHS   insulin aspart  4 Units Subcutaneous TID WC   insulin detemir  15 Units Subcutaneous Daily   isosorbide mononitrate  15 mg Oral Daily   sodium chloride flush  3 mL Intravenous Q12H   Continuous Infusions:  sodium chloride Stopped (12/24/22 0730)   sodium chloride     heparin 1,600 Units/hr (12/25/22 0700)   PRN Meds: sodium chloride, acetaminophen, morphine injection, ondansetron (ZOFRAN) IV, mouth rinse, sodium chloride flush, traMADol   Vital Signs    Vitals:   12/25/22 0745 12/25/22 0748 12/25/22 0800 12/25/22 0815  BP: 107/77  103/77 106/77  Pulse: 69  (!) 54 60  Resp: (!) 21  (!) 21 18  Temp:  99.3 F (37.4 C)    TempSrc:  Oral    SpO2: 98%  97% 98%  Weight:      Height:        Intake/Output Summary (Last 24 hours) at 12/25/2022 0843 Last data filed at 12/25/2022 0700 Gross per 24 hour  Intake 372.22 ml  Output 250 ml  Net 122.22 ml      12/23/2022   10:01 PM 10/15/2015   10:26 AM  Last 3 Weights  Weight (lbs) 192 lb 205 lb  Weight (kg) 87.091 kg 92.987 kg      Telemetry    Second degree Mobitz I - Personally Reviewed  ECG    Second degree Mobitz I- Personally  Reviewed  Physical Exam   GEN: No acute distress.   Neck: No JVD Cardiac: RRR, no murmurs, rubs, or gallops.  Respiratory: Clear to auscultation bilaterally. GI: Soft, nontender, non-distended  MS: No edema; No deformity. Neuro:  Nonfocal  Psych: Normal affect   Labs    High Sensitivity Troponin:   Recent Labs  Lab 12/23/22 2207 12/24/22 0047  TROPONINIHS 4,852* 4,524*     Chemistry Recent Labs  Lab 12/23/22 2210 12/24/22 0047  NA 132* 133*  K 3.8 3.9  CL 96* 95*  CO2 25 23  GLUCOSE 342* 358*  BUN 25* 23*  CREATININE 1.30* 1.41*  CALCIUM 8.5* 8.6*  MG  --  2.0  PROT 7.8  --   ALBUMIN 3.8  --   AST 53*  --   ALT 28  --   ALKPHOS 92  --   BILITOT 1.3*  --   GFRNONAA >60 58*  ANIONGAP 11 15    Lipids  Recent Labs  Lab 12/23/22 2210  CHOL 189  TRIG 106  HDL 56  LDLCALC NOT CALCULATED  CHOLHDL 3.4    Hematology Recent Labs  Lab 12/23/22 2210 12/25/22 0051  WBC 13.1*  13.0*  RBC 4.49 3.94*  HGB 13.1 11.5*  HCT 38.7* 34.6*  MCV 86.2 87.8  MCH 29.2 29.2  MCHC 33.9 33.2  RDW 12.5 12.7  PLT 284 248   Thyroid No results for input(s): "TSH", "FREET4" in the last 168 hours.  BNPNo results for input(s): "BNP", "PROBNP" in the last 168 hours.  DDimer No results for input(s): "DDIMER" in the last 168 hours.   Radiology    ECHOCARDIOGRAM COMPLETE  Result Date: 12/24/2022    ECHOCARDIOGRAM REPORT   Patient Name:   Ryan Petersen Date of Exam: 12/24/2022 Medical Rec #:  [id number]     Height:       70.0 in Accession #:    [id number]    Weight:       192.0 lb Date of Birth:  Jan 13, 1966     BSA:          2.051 m Patient Age:    56 years      BP:           92/78 mmHg Patient Gender: M             HR:           56 bpm. Exam Location:  Inpatient Procedure: 2D Echo, Color Doppler and Cardiac Doppler Indications:    acute myocardial infarction  History:        Patient has no prior history of Echocardiogram examinations.                 Temporary pacemaker;  Signs/Symptoms:Chest Pain.  Sonographer:    Delcie Roch RDCS Referring Phys: [phone] PETER M [place] IMPRESSIONS  1. Left ventricular ejection fraction, by estimation, is 40 to 45%. The left ventricle has mildly decreased function. The left ventricle has no regional wall motion abnormalities. There is mild concentric left ventricular hypertrophy. Left ventricular diastolic parameters are indeterminate.  2. Right ventricular systolic function mild to moderately reduced. The right ventricular size is normal. Tricuspid regurgitation signal is inadequate for assessing PA pressure.  3. The mitral valve is normal in structure. No evidence of mitral valve regurgitation. No evidence of mitral stenosis.  4. The aortic valve is tricuspid. There is mild thickening of the aortic valve. Aortic valve regurgitation is not visualized. No aortic stenosis is present.  5. The inferior vena cava is dilated in size with <50% respiratory variability, suggesting right atrial pressure of 15 mmHg. Comparison(s): No prior Echocardiogram. FINDINGS  Left Ventricle: Left ventricular ejection fraction, by estimation, is 40 to 45%. The left ventricle has mildly decreased function. The left ventricle has no regional wall motion abnormalities. The left ventricular internal cavity size was normal in size. There is mild concentric left ventricular hypertrophy. Left ventricular diastolic function could not be evaluated due to paced rhythm. Left ventricular diastolic parameters are indeterminate.  LV Wall Scoring: The inferior wall and posterior wall are hypokinetic. Right Ventricle: The right ventricular size is normal. No increase in right ventricular wall thickness. Right ventricular systolic function mild to moderately reduced. Tricuspid regurgitation signal is inadequate for assessing PA pressure. Left Atrium: Left atrial size was normal in size. Right Atrium: Right atrial size was normal in size. Pericardium: There is no evidence of pericardial  effusion. Mitral Valve: The mitral valve is normal in structure. No evidence of mitral valve regurgitation. No evidence of mitral valve stenosis. Tricuspid Valve: The tricuspid valve is normal in structure. Tricuspid valve regurgitation is mild . No evidence of tricuspid stenosis. Aortic Valve: The aortic  valve is tricuspid. There is mild thickening of the aortic valve. Aortic valve regurgitation is not visualized. No aortic stenosis is present. Pulmonic Valve: The pulmonic valve was normal in structure. Pulmonic valve regurgitation is mild to moderate. No evidence of pulmonic stenosis. Aorta: The aortic root and ascending aorta are structurally normal, with no evidence of dilitation. Venous: The inferior vena cava is dilated in size with less than 50% respiratory variability, suggesting right atrial pressure of 15 mmHg. IAS/Shunts: No atrial level shunt detected by color flow Doppler. Additional Comments: A device lead is visualized in the right ventricle and right atrium.  LEFT VENTRICLE PLAX 2D LVIDd:         4.80 cm LVIDs:         3.70 cm LV PW:         1.20 cm LV IVS:        1.20 cm LVOT diam:     2.40 cm LV SV:         58 LV SV Index:   28 LVOT Area:     4.52 cm  RIGHT VENTRICLE            IVC RV Basal diam:  2.90 cm    IVC diam: 2.60 cm RV S prime:     6.74 cm/s TAPSE (M-mode): 1.3 cm LEFT ATRIUM             Index        RIGHT ATRIUM           Index LA diam:        3.30 cm 1.61 cm/m   RA Area:     14.90 cm LA Vol (A2C):   64.6 ml 31.49 ml/m  RA Volume:   35.80 ml  17.45 ml/m LA Vol (A4C):   44.2 ml 21.55 ml/m LA Biplane Vol: 54.6 ml 26.62 ml/m  AORTIC VALVE LVOT Vmax:   74.40 cm/s LVOT Vmean:  44.500 cm/s LVOT VTI:    0.128 m  AORTA Ao Root diam: 3.70 cm Ao Asc diam:  3.80 cm TRICUSPID VALVE TR Peak grad:   14.1 mmHg TR Vmax:        188.00 cm/s  SHUNTS Systemic VTI:  0.13 m Systemic Diam: 2.40 cm Riley Lam MD Electronically signed by Riley Lam MD Signature Date/Time:  12/24/2022/2:04:50 PM    Final    CARDIAC CATHETERIZATION  Result Date: 12/24/2022 Successful transvenous pacemaker placement via right internal jugular access under fluoroscopic guidance. Pacemaker set at a backup rate of 40 bpm Pacing threshold 0.6 MA No immediate complication   CARDIAC CATHETERIZATION  Result Date: 12/23/2022   1st Mrg lesion is 70% stenosed.   Prox RCA to Dist RCA lesion is 100% stenosed.   There is mild left ventricular systolic dysfunction.   LV end diastolic pressure is normal.   The left ventricular ejection fraction is 50-55% by visual estimate. 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 Minimal left to right collaterals. Mild LV dysfunction with basal to mid inferior akinesis Normal LVEDP Plan; patient presents with more than 48 hours of continuous chest pain. Now with minimal chest pressure. Findings c/w completed inferior infarct. I don't think there is an indication for reperfusion at this time. Will manage medically.   DG Chest Port 1 View  Result Date: 12/23/2022 CLINICAL DATA:  Stemi CP since Friday and worse since Saturday especially since playing basketball - got SOB and weak and cannot take a full breath. EXAM: PORTABLE CHEST 1 VIEW  COMPARISON:  None Available. FINDINGS: Cardiac paddles overlie the chest. Enlarged cardiac silhouette. Otherwise the heart and mediastinal contours are within normal limits. No focal consolidation. No pulmonary edema. No pleural effusion. No pneumothorax. No acute osseous abnormality. IMPRESSION: 1. No active disease. 2. Enlarged cardiac silhouette with some component likely due to portable AP technique. Electronically Signed   By: Tish Frederickson M.D.   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 Minimal left to right collaterals. Mild LV dysfunction with basal to mid inferior akinesis Normal LVEDP  TTE 4/16 1. Left ventricular ejection fraction, by  estimation, is 40 to 45%. The  left ventricle has mildly decreased function. The left ventricle has no  regional wall motion abnormalities. There is mild concentric left  ventricular hypertrophy. Left ventricular  diastolic parameters are indeterminate.   2. Right ventricular systolic function mild to moderately reduced. The  right ventricular size is normal. Tricuspid regurgitation signal is  inadequate for assessing PA pressure.   3. The mitral valve is normal in structure. No evidence of mitral valve  regurgitation. No evidence of mitral stenosis.   4. The aortic valve is tricuspid. There is mild thickening of the aortic  valve. Aortic valve regurgitation is not visualized. No aortic stenosis is  present.   5. The inferior vena cava is dilated in size with <50% respiratory  variability, suggesting right atrial pressure of 15 mmHg.   Patient Profile     57 y.o. male T2DM (poorly controlled), medical noncompliance, HTN, HL here with late presentation inferior STEMI (no PCI) now with intermittent 2:1 AV block  Assessment & Plan    Late presentation STEMI: PCI deferred due to late presentation.  Continue Plavix, aspirin, statin and heparin drip for 48 hours.  Hold BB given conduction issues.  Continue colchine, morphine for pain.  D/C Imdur. Cardiomyopathy:  Moderate LV dysfunction on TTE yesterday.  Start losartan 12.5mg  QPM, jardiance . 2-1 AV block: Temp wire in.  Conduction seems to have improved from 2:1 to Type 2 mobitz 1 block.  Appreciate EP recs.  Plan to monitor with TVP in re: need for PPM Hypertension: BP well controlled, optimize regimen as above given moderate LV dysfunction, no BB. Hyperlipidemia: Continue high-dose atorvastatin Type 2 diabetes: Start Jardiance, cont ASA, statin; on SSI insulin.  For questions or updates, please contact [facility] Please consult [email or web address] for contact info under    CRITICAL CARE Performed by: Orbie Pyo   Total  critical care time: 30 minutes  Critical care time was exclusive of separately billable procedures and treating other patients.  Critical care was necessary to treat or prevent imminent or life-threatening deterioration.  Critical care was time spent personally by me on the following activities: development of treatment plan with patient and/or surrogate as well as nursing, discussions with consultants, evaluation of patient's response to treatment, examination of patient, obtaining history from patient or surrogate, ordering and performing treatments and interventions, ordering and review of laboratory studies, ordering and review of radiographic studies, pulse oximetry and re-evaluation of patient's condition.     Signed, Orbie Pyo, MD  12/25/2022, 8:43 AM

## 2022-12-25 NOTE — Inpatient Diabetes Management (Signed)
Inpatient Diabetes Program Recommendations  AACE/ADA: New Consensus Statement on Inpatient Glycemic Control (2015)  Target Ranges:  Prepandial:   less than 140 mg/dL      Peak postprandial:   less than 180 mg/dL (1-2 hours)      Critically ill patients:  140 - 180 mg/dL   Lab Results  Component Value Date   GLUCAP 204 (H) 12/25/2022   HGBA1C 13.2 (H) 12/23/2022    Review of Glycemic Control  Diabetes history: DM2 Outpatient Diabetes medications: None Current orders for Inpatient glycemic control: Semglee 15 QHS, Novolog 0-20 TID with meals and 0-5 HS + 4 units TID, Jardiance 10 mg QD  HgbA1C - 13.2%  Inpatient Diabetes Program Recommendations:    Consider increasing Novolog to 5 units TID  Spoke with pt and wife at bedside regarding his diabetes and HgbA1C of 13.2% (average blood sugar 332 mg/dL over past 2-3 months.) Pt states his HgbA1C was > 12% a couple of years ago and he got it down to 6% with diet, exercise, and lifestyle modification. 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 Is familiar with insulin pen although he has never been on insulin. Has glucose meter at home although has not checked blood sugars lately. States "I've gotten off track with diet and exercise, have just been lazy with my health." Has PCP at [place]. Discussed importance of checking CBGs and maintaining good CBG control to prevent long-term and short-term complications. Explained how hyperglycemia leads to damage within blood vessels which lead to the common complications seen with uncontrolled diabetes. Stressed to the patient the importance of improving glycemic control to prevent further complications from uncontrolled diabetes. Discussed impact of nutrition, exercise, stress, sickness, and medications on diabetes control. Pt is ready to make changes and states he willing to do what it takes to control his blood sugars.  Demonstrated insulin pen although pt did  not return demonstration today. Wife cooks fairly healthy and admits pt has a sweet tooth. Will require basal and bolus insulin at discharge. Stressed importance of f/u with PCP after discharge. May be candidate for CGM. Will need to pursue insurance info for which insulins/meds are preferred. Answered all questions.   F/U on 4/18 regarding insulin pen teach-back, preferred meds at discharge, and more info on CGMs and SGLT-2 inhibitor.

## 2022-12-25 NOTE — Progress Notes (Signed)
ANTICOAGULATION CONSULT NOTE - Follow Up Consult  Pharmacy Consult for heparin Indication:  medical management of completed MI  Labs: Recent Labs    12/23/22 2207 12/23/22 2210 12/24/22 0047 12/24/22 1611 12/25/22 0051  HGB  --  13.1  --   --  11.5*  HCT  --  38.7*  --   --  34.6*  PLT  --  284  --   --  248  APTT  --  30  --   --   --   LABPROT  --  14.6  --   --   --   INR  --  1.2  --   --   --   HEPARINUNFRC  --   --   --  <0.10* 0.27*  CREATININE  --  1.30* 1.41*  --   --   TROPONINIHS 4,782*  --  4,524*  --   --     Assessment: 57yo male subtherapeutic on heparin after rate change but approaching goal; no infusion issues or signs of bleeding per RN.  Goal of Therapy:  Heparin level 0.3-0.7 units/ml   Plan:  Will increase heparin infusion slightly to 1600 units/hr and check level in 6 hours.    Vernard Gambles, PharmD, BCPS  12/25/2022,3:51 AM

## 2022-12-25 NOTE — Progress Notes (Signed)
Rounding Note    Patient Name: Ryan Petersen Date of Encounter: 12/25/2022  [facility] Cardiologist: None   Subjective   No CP this AM,  comfortable  Inpatient Medications    Scheduled Meds:  aspirin  81 mg Oral Daily   atorvastatin  80 mg Oral Daily   Chlorhexidine Gluconate Cloth  6 each Topical Daily   clopidogrel  75 mg Oral Daily   colchicine  0.6 mg Oral BID   gabapentin  600 mg Oral Q12H   insulin aspart  0-20 Units Subcutaneous TID WC   insulin aspart  0-5 Units Subcutaneous QHS   insulin aspart  4 Units Subcutaneous TID WC   insulin detemir  15 Units Subcutaneous Daily   isosorbide mononitrate  15 mg Oral Daily   sodium chloride flush  3 mL Intravenous Q12H   Continuous Infusions:  sodium chloride Stopped (12/24/22 0730)   sodium chloride     heparin 1,600 Units/hr (12/25/22 0700)   PRN Meds: sodium chloride, acetaminophen, morphine injection, ondansetron (ZOFRAN) IV, mouth rinse, sodium chloride flush, traMADol   Vital Signs    Vitals:   12/25/22 0630 12/25/22 0645 12/25/22 0700 12/25/22 0748  BP: 115/80 106/78 116/78   Pulse: (!) 51 66 66   Resp: 12 (!) 24 20   Temp:    99.3 F (37.4 C)  TempSrc:    Oral  SpO2: 98% 99% 98%   Weight:      Height:        Intake/Output Summary (Last 24 hours) at 12/25/2022 0804 Last data filed at 12/25/2022 0700 Gross per 24 hour  Intake 372.22 ml  Output 250 ml  Net 122.22 ml      12/23/2022   10:01 PM 10/15/2015   10:26 AM  Last 3 Weights  Weight (lbs) 192 lb 205 lb  Weight (kg) 87.091 kg 92.987 kg      Telemetry    SR, Mobitz one, 2:1, rates 60's - Personally Reviewed  ECG    No new EKGs - Personally Reviewed  Physical Exam   GEN: No acute distress.   Neck: No JVD, R IJ site is stable Cardiac: regularly irregular, no murmurs, rubs, or gallops.  Respiratory: CTA b/l. GI: Soft, nontender, non-distended  MS: No edema; No deformity. Neuro:  Nonfocal  Psych: Normal affect   Labs     High Sensitivity Troponin:   Recent Labs  Lab 12/23/22 2207 12/24/22 0047  TROPONINIHS 4,852* 4,524*     Chemistry Recent Labs  Lab 12/23/22 2210 12/24/22 0047  NA 132* 133*  K 3.8 3.9  CL 96* 95*  CO2 25 23  GLUCOSE 342* 358*  BUN 25* 23*  CREATININE 1.30* 1.41*  CALCIUM 8.5* 8.6*  MG  --  2.0  PROT 7.8  --   ALBUMIN 3.8  --   AST 53*  --   ALT 28  --   ALKPHOS 92  --   BILITOT 1.3*  --   GFRNONAA >60 58*  ANIONGAP 11 15    Lipids  Recent Labs  Lab 12/23/22 2210  CHOL 189  TRIG 106  HDL 56  LDLCALC NOT CALCULATED  CHOLHDL 3.4    Hematology Recent Labs  Lab 12/23/22 2210 12/25/22 0051  WBC 13.1* 13.0*  RBC 4.49 3.94*  HGB 13.1 11.5*  HCT 38.7* 34.6*  MCV 86.2 87.8  MCH 29.2 29.2  MCHC 33.9 33.2  RDW 12.5 12.7  PLT 284 248   Thyroid No results for  input(s): "TSH", "FREET4" in the last 168 hours.  BNPNo results for input(s): "BNP", "PROBNP" in the last 168 hours.  DDimer No results for input(s): "DDIMER" in the last 168 hours.   Radiology      Cardiac Studies   12/24/22: TTE 1. Left ventricular ejection fraction, by estimation, is 40 to 45%. The  left ventricle has mildly decreased function. The left ventricle has no  regional wall motion abnormalities. There is mild concentric left  ventricular hypertrophy. Left ventricular  diastolic parameters are indeterminate.   2. Right ventricular systolic function mild to moderately reduced. The  right ventricular size is normal. Tricuspid regurgitation signal is  inadequate for assessing PA pressure.   3. The mitral valve is normal in structure. No evidence of mitral valve  regurgitation. No evidence of mitral stenosis.   4. The aortic valve is tricuspid. There is mild thickening of the aortic  valve. Aortic valve regurgitation is not visualized. No aortic stenosis is  present.   5. The inferior vena cava is dilated in size with <50% respiratory  variability, suggesting right atrial pressure of  15 mmHg.   Comparison(s): No prior Echocardiogram   12/23/22: LHC 1st Mrg lesion is 70% stenosed.   Prox RCA to Dist RCA lesion is 100% stenosed.   There is mild left ventricular systolic dysfunction.   LV end diastolic pressure is normal.   The left ventricular ejection fraction is 50-55% by visual estimate.   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 Minimal left to right collaterals. Mild LV dysfunction with basal to mid inferior akinesis Normal LVEDP   Plan; patient presents with more than 48 hours of continuous chest pain. Now with minimal chest pressure. Findings c/w completed inferior infarct. I don't think there is an indication for reperfusion at this time. Will manage medically.   Patient Profile     57 y.o. male HTN, DM, HLD admitted with late presenting Inferior STEMI  Assessment & Plan    Variable heart block Mobitz one and 2:1 conduction No bradycardia/pacing noted S/p temp wire Threshold this AM is 0.8, left at 40bpm/56mA   S/p late presenting STEMI Cath with occl RCA > not intervened on given with more than 48 hours of continuous chest pain. Now with minimal chest pressure. Findings c/w completed inferior infarct.   Avoid nodal blocking agents Temp wire in  Follow conduction as his clinical course evolves  If no improvement in conduction might look towards Friday, maybe next week to consider PPM    For questions or updates, please contact [facility] Please consult [email or web address] for contact info under        Signed, Sheilah Pigeon, PA-C  12/25/2022, 8:04 AM

## 2022-12-25 NOTE — Progress Notes (Signed)
Began discussing ed with pt and wife. Discussed MI, restrictions, and CRPII. Began discussing diet and importance of managing DM. Pt would benefit from DM consult. He sts he has controlled his DM through diet and exercise in the past.  Will refer to [place] CRPII. Will f/u with pt when he is off bedrest.  [id number] Ethelda Chick BS, ACSM-CEP 12/25/2022 3:08 PM

## 2022-12-26 ENCOUNTER — Inpatient Hospital Stay (HOSPITAL_COMMUNITY): Payer: No Typology Code available for payment source

## 2022-12-26 DIAGNOSIS — I213 ST elevation (STEMI) myocardial infarction of unspecified site: Secondary | ICD-10-CM

## 2022-12-26 DIAGNOSIS — I442 Atrioventricular block, complete: Secondary | ICD-10-CM | POA: Diagnosis not present

## 2022-12-26 DIAGNOSIS — R011 Cardiac murmur, unspecified: Secondary | ICD-10-CM

## 2022-12-26 LAB — ECHOCARDIOGRAM LIMITED
AR max vel: 4.15 cm2
AV Area VTI: 4.23 cm2
AV Area mean vel: 4.41 cm2
AV Mean grad: 3 mmHg
AV Peak grad: 4.8 mmHg
Ao pk vel: 1.1 m/s
Area-P 1/2: 4.36 cm2
Calc EF: 56.5 %
Height: 70 in
MV VTI: 2.58 cm2
S' Lateral: 3.5 cm
Single Plane A2C EF: 56.2 %
Single Plane A4C EF: 56.6 %
Weight: 3072 oz

## 2022-12-26 LAB — BASIC METABOLIC PANEL
Anion gap: 10 (ref 5–15)
BUN: 35 mg/dL — ABNORMAL HIGH (ref 6–20)
CO2: 23 mmol/L (ref 22–32)
Calcium: 8.3 mg/dL — ABNORMAL LOW (ref 8.9–10.3)
Chloride: 100 mmol/L (ref 98–111)
Creatinine, Ser: 1.64 mg/dL — ABNORMAL HIGH (ref 0.61–1.24)
GFR, Estimated: 48 mL/min — ABNORMAL LOW (ref >60)
Glucose, Bld: 110 mg/dL — ABNORMAL HIGH (ref 70–99)
Potassium: 3.7 mmol/L (ref 3.5–5.1)
Sodium: 133 mmol/L — ABNORMAL LOW (ref 135–145)

## 2022-12-26 LAB — LIPOPROTEIN A (LPA): Lipoprotein (a): 195.1 nmol/L — ABNORMAL HIGH (ref <75.0)

## 2022-12-26 LAB — CBC
HCT: 32 % — ABNORMAL LOW (ref 39.0–52.0)
Hemoglobin: 10.8 g/dL — ABNORMAL LOW (ref 13.0–17.0)
MCH: 29.8 pg (ref 26.0–34.0)
MCHC: 33.8 g/dL (ref 30.0–36.0)
MCV: 88.4 fL (ref 80.0–100.0)
Platelets: 271 10*3/uL (ref 150–400)
RBC: 3.62 MIL/uL — ABNORMAL LOW (ref 4.22–5.81)
RDW: 12.7 % (ref 11.5–15.5)
WBC: 10.7 10*3/uL — ABNORMAL HIGH (ref 4.0–10.5)
nRBC: 0 % (ref 0.0–0.2)

## 2022-12-26 LAB — GLUCOSE, CAPILLARY
Glucose-Capillary: 95 mg/dL (ref 70–99)
Glucose-Capillary: 204 mg/dL — ABNORMAL HIGH (ref 70–99)
Glucose-Capillary: 126 mg/dL — ABNORMAL HIGH (ref 70–99)
Glucose-Capillary: 171 mg/dL — ABNORMAL HIGH (ref 70–99)

## 2022-12-26 LAB — HEPARIN LEVEL (UNFRACTIONATED): Heparin Unfractionated: <0.1 IU/mL — ABNORMAL LOW (ref 0.30–0.70)

## 2022-12-26 MED ORDER — ENOXAPARIN SODIUM 40 MG/0.4ML IJ SOSY
40.0000 mg | PREFILLED_SYRINGE | INTRAMUSCULAR | Status: DC
  Administered 2022-12-26 – 2022-12-29 (×4): 40 mg via SUBCUTANEOUS
  Filled 2022-12-26 (×4): qty 0.4

## 2022-12-26 NOTE — Inpatient Diabetes Management (Signed)
Inpatient Diabetes Program Recommendations  AACE/ADA: New Consensus Statement on Inpatient Glycemic Control (2015)  Target Ranges:  Prepandial:   less than 140 mg/dL      Peak postprandial:   less than 180 mg/dL (1-2 hours)      Critically ill patients:  140 - 180 mg/dL   Lab Results  Component Value Date   GLUCAP 204 (H) 12/26/2022   HGBA1C 13.2 (H) 12/23/2022    Review of Glycemic Control  Diabetes history: DM2 Outpatient Diabetes medications: None Current orders for Inpatient glycemic control: Semglee 15 QHS, Novolog 0-20 TID with meals and 0-5 HS + 4 units TID, Jardiance 10 mg QD   HgbA1C - 13.2%   Inpatient Diabetes Program Recommendations:     Consider increasing Semglee 20 units QHS (Of note, patient received Levemir 15 units QD in addition to Semglee 15 units QHS for 12/26/22).   Spoke with patient regarding diabetes management again and to reinforce concepts discussed with DM coordinator on 4/17.  Reviewed patient's current A1c of 13.2%. Explained what a A1c is and what it measures. Also reviewed goal A1c with patient, importance of good glucose control @ home, and blood sugar goals. Reviewed differences between long acting and short acting insulin, SGLT2 inhibitors, CGMs, working with [facility] for benefits, importance of establishing care with endocrinology especially to help stay on track with lifestyle and adjustment to insulins, impact from cardiac perspectives, vascular changes and other commorbidities.  Patient will need a meter at discharge. Discussed benefits of CGM and using as a tool for foods higher in CHO and following trends. Will follow up on Monday for possible application; patient is interested.  Educated patient on insulin pen use at home. Reviewed contents of insulin flexpen starter kit. Reviewed all steps if insulin pen including attachment of needle, 2-unit air shot, dialing up dose, giving injection, removing needle, disposal of sharps, storage of unused insulin,  disposal of insulin etc. Patient able to provide successful return demonstration. Also reviewed troubleshooting with insulin pen. MD to give patient Rxs for insulin pens and insulin pen needles.   Thanks, Lujean Rave, MSN, RNC-OB Diabetes Coordinator [phone] (8a-5p)

## 2022-12-26 NOTE — Progress Notes (Signed)
CARDIAC REHAB PHASE I   PRE:  Rate/Rhythm: 82 A-fib  BP:  Sitting: 109/72      SaO2: 94  MODE:  Ambulation: 370 ft   AD:  None  POST:  Rate/Rhythm: 108 A-fib  BP:  Sitting:       SaO2:   Pt amb with supervision assist, denies unusual symptoms while walking.   Faustino Congress  12:14 PM 12/26/2022    Service time is from 1140 to 1214.

## 2022-12-26 NOTE — TOC Initial Note (Addendum)
Transition of Care [facility]) - Initial/Assessment Note    Patient Details  Name: Ryan Petersen MRN: [id number] Date of Birth: [birth year]  Transition of Care [facility]) CM/SW Contact:    Elliot Cousin, RN Phone Number: [phone] 12/26/2022, 11:39 AM  Clinical Narrative:                 CM spoke to pt and states he has [facility] benefits. Wanted to have card added to his file. Contacted [facility] notification line, Auth ID [id number], notification ID [phone]. Pt lives at home with wife and states he was independent PTA admission. States his employer is working on his FMLA/disability paperwork. Provided pt with CM contact if assistance is needed. Will continue to follow for dc needs.  Faxed insurance card to Admitting.   Expected Discharge Plan: Home/Self Care Barriers to Discharge: Continued Medical Work up   Patient Goals and CMS Choice Patient states their goals for this hospitalization and ongoing recovery are:: wants to recover fully          Expected Discharge Plan and Services   Discharge Planning Services: CM Consult   Living arrangements for the past 2 months: Single Family Home                                      Prior Living Arrangements/Services Living arrangements for the past 2 months: Single Family Home Lives with:: Spouse Patient language and need for interpreter reviewed:: Yes        Need for Family Participation in Patient Care: No (Comment) Care giver support system in place?: Yes (comment)   Criminal Activity/Legal Involvement Pertinent to Current Situation/Hospitalization: No - Comment as needed  Activities of Daily Living      Permission Sought/Granted Permission sought to share information with : Case Manager, Family Supports, PCP Permission granted to share information with : Yes, Verbal Permission Granted  Share Information with NAME: Ryan Petersen     Permission granted to share info w Relationship: wife  Permission granted to  share info w Contact Information: [phone]  Emotional Assessment Appearance:: Appears stated age Attitude/Demeanor/Rapport: Engaged Affect (typically observed): Accepting Orientation: : Oriented to Self, Oriented to Place, Oriented to  Time, Oriented to Situation   Psych Involvement: No (comment)  Admission diagnosis:  ST elevation myocardial infarction (STEMI), unspecified artery [I21.3] STEMI involving right coronary artery [I21.11] Patient Active Problem List   Diagnosis Date Noted   ST elevation myocardial infarction (STEMI) 12/23/2022   PCP:  Pearson Grippe, MD Pharmacy:   [facility] - [place], [place] - [address] [place] [address] Phone: [phone] Fax: [phone]     Social Determinants of Health (SDOH) Social History: SDOH Screenings   Tobacco Use: Unknown (12/24/2022)   SDOH Interventions:     Readmission Risk Interventions     No data to display

## 2022-12-26 NOTE — Progress Notes (Addendum)
Rounding Note    Patient Name: Ryan Petersen Date of Encounter: 12/26/2022  [facility] Cardiologist: [place]  Subjective   No acute events overnight  No pacing overnight  Tele with variable block  2:1 and second degree mobitz 1 block  Chest pain improving  Inpatient Medications    Scheduled Meds:  aspirin  81 mg Oral Daily   atorvastatin  80 mg Oral Daily   Chlorhexidine Gluconate Cloth  6 each Topical Daily   clopidogrel  75 mg Oral Daily   colchicine  0.6 mg Oral BID   empagliflozin  10 mg Oral Daily   gabapentin  600 mg Oral Q12H   insulin aspart  0-20 Units Subcutaneous TID WC   insulin aspart  0-5 Units Subcutaneous QHS   insulin aspart  4 Units Subcutaneous TID WC   insulin glargine-yfgn  15 Units Subcutaneous QHS   losartan  12.5 mg Oral QHS   sodium chloride flush  3 mL Intravenous Q12H   Continuous Infusions:  sodium chloride Stopped (12/24/22 0730)   sodium chloride     PRN Meds: sodium chloride, acetaminophen, morphine injection, ondansetron (ZOFRAN) IV, mouth rinse, sodium chloride flush, traMADol   Vital Signs    Vitals:   12/26/22 0430 12/26/22 0500 12/26/22 0530 12/26/22 0600  BP: 112/72 115/71 100/74 97/70  Pulse: (!) 55 (!) 54 (!) 56 (!) 57  Resp: 15 10 (!) 21 10  Temp:      TempSrc:      SpO2: 96% 96% 96% 96%  Weight:      Height:        Intake/Output Summary (Last 24 hours) at 12/26/2022 0642 Last data filed at 12/26/2022 0600 Gross per 24 hour  Intake 272.02 ml  Output 2025 ml  Net -1752.98 ml      12/23/2022   10:01 PM 10/15/2015   10:26 AM  Last 3 Weights  Weight (lbs) 192 lb 205 lb  Weight (kg) 87.091 kg 92.987 kg      Telemetry    Second degree Mobitz I - Personally Reviewed  ECG    Second degree Mobitz I- Personally Reviewed  Physical Exam   GEN: No acute distress.   Neck: No JVD Cardiac: RRR, + systolic murmur, rubs, or gallops.  Respiratory: Clear to auscultation bilaterally. GI: Soft, nontender,  non-distended  MS: No edema; No deformity. Neuro:  Nonfocal  Psych: Normal affect   Labs    High Sensitivity Troponin:   Recent Labs  Lab 12/23/22 2207 12/24/22 0047  TROPONINIHS 4,852* 4,524*     Chemistry Recent Labs  Lab 12/23/22 2210 12/24/22 0047 12/26/22 0052  NA 132* 133* 133*  K 3.8 3.9 3.7  CL 96* 95* 100  CO2 25 23 23   GLUCOSE 342* 358* 110*  BUN 25* 23* 35*  CREATININE 1.30* 1.41* 1.64*  CALCIUM 8.5* 8.6* 8.3*  MG  --  2.0  --   PROT 7.8  --   --   ALBUMIN 3.8  --   --   AST 53*  --   --   ALT 28  --   --   ALKPHOS 92  --   --   BILITOT 1.3*  --   --   GFRNONAA >60 58* 48*  ANIONGAP 11 15 10     Lipids  Recent Labs  Lab 12/23/22 2210  CHOL 189  TRIG 106  HDL 56  LDLCALC NOT CALCULATED  CHOLHDL 3.4    Hematology Recent Labs  Lab  12/23/22 2210 12/25/22 0051 12/26/22 0052  WBC 13.1* 13.0* 10.7*  RBC 4.49 3.94* 3.62*  HGB 13.1 11.5* 10.8*  HCT 38.7* 34.6* 32.0*  MCV 86.2 87.8 88.4  MCH 29.2 29.2 29.8  MCHC 33.9 33.2 33.8  RDW 12.5 12.7 12.7  PLT 284 248 271   Thyroid No results for input(s): "TSH", "FREET4" in the last 168 hours.  BNPNo results for input(s): "BNP", "PROBNP" in the last 168 hours.  DDimer No results for input(s): "DDIMER" in the last 168 hours.   Radiology    ECHOCARDIOGRAM COMPLETE  Result Date: 12/24/2022    ECHOCARDIOGRAM REPORT   Patient Name:   CLAUDELL WOHLER Date of Exam: 12/24/2022 Medical Rec #:  [id number]     Height:       70.0 in Accession #:    [id number]    Weight:       192.0 lb Date of Birth:  [birth year]     BSA:          2.051 m Patient Age:    56 years      BP:           92/78 mmHg Patient Gender: M             HR:           56 bpm. Exam Location:  Inpatient Procedure: 2D Echo, Color Doppler and Cardiac Doppler Indications:    acute myocardial infarction  History:        Patient has no prior history of Echocardiogram examinations.                 Temporary pacemaker; Signs/Symptoms:Chest Pain.  Sonographer:     Delcie Roch RDCS Referring Phys: [phone] PETER M [place] IMPRESSIONS  1. Left ventricular ejection fraction, by estimation, is 40 to 45%. The left ventricle has mildly decreased function. The left ventricle has no regional wall motion abnormalities. There is mild concentric left ventricular hypertrophy. Left ventricular diastolic parameters are indeterminate.  2. Right ventricular systolic function mild to moderately reduced. The right ventricular size is normal. Tricuspid regurgitation signal is inadequate for assessing PA pressure.  3. The mitral valve is normal in structure. No evidence of mitral valve regurgitation. No evidence of mitral stenosis.  4. The aortic valve is tricuspid. There is mild thickening of the aortic valve. Aortic valve regurgitation is not visualized. No aortic stenosis is present.  5. The inferior vena cava is dilated in size with <50% respiratory variability, suggesting right atrial pressure of 15 mmHg. Comparison(s): No prior Echocardiogram. FINDINGS  Left Ventricle: Left ventricular ejection fraction, by estimation, is 40 to 45%. The left ventricle has mildly decreased function. The left ventricle has no regional wall motion abnormalities. The left ventricular internal cavity size was normal in size. There is mild concentric left ventricular hypertrophy. Left ventricular diastolic function could not be evaluated due to paced rhythm. Left ventricular diastolic parameters are indeterminate.  LV Wall Scoring: The inferior wall and posterior wall are hypokinetic. Right Ventricle: The right ventricular size is normal. No increase in right ventricular wall thickness. Right ventricular systolic function mild to moderately reduced. Tricuspid regurgitation signal is inadequate for assessing PA pressure. Left Atrium: Left atrial size was normal in size. Right Atrium: Right atrial size was normal in size. Pericardium: There is no evidence of pericardial effusion. Mitral Valve: The mitral valve is  normal in structure. No evidence of mitral valve regurgitation. No evidence of mitral valve stenosis. Tricuspid Valve: The tricuspid valve is  normal in structure. Tricuspid valve regurgitation is mild . No evidence of tricuspid stenosis. Aortic Valve: The aortic valve is tricuspid. There is mild thickening of the aortic valve. Aortic valve regurgitation is not visualized. No aortic stenosis is present. Pulmonic Valve: The pulmonic valve was normal in structure. Pulmonic valve regurgitation is mild to moderate. No evidence of pulmonic stenosis. Aorta: The aortic root and ascending aorta are structurally normal, with no evidence of dilitation. Venous: The inferior vena cava is dilated in size with less than 50% respiratory variability, suggesting right atrial pressure of 15 mmHg. IAS/Shunts: No atrial level shunt detected by color flow Doppler. Additional Comments: A device lead is visualized in the right ventricle and right atrium.  LEFT VENTRICLE PLAX 2D LVIDd:         4.80 cm LVIDs:         3.70 cm LV PW:         1.20 cm LV IVS:        1.20 cm LVOT diam:     2.40 cm LV SV:         58 LV SV Index:   28 LVOT Area:     4.52 cm  RIGHT VENTRICLE            IVC RV Basal diam:  2.90 cm    IVC diam: 2.60 cm RV S prime:     6.74 cm/s TAPSE (M-mode): 1.3 cm LEFT ATRIUM             Index        RIGHT ATRIUM           Index LA diam:        3.30 cm 1.61 cm/m   RA Area:     14.90 cm LA Vol (A2C):   64.6 ml 31.49 ml/m  RA Volume:   35.80 ml  17.45 ml/m LA Vol (A4C):   44.2 ml 21.55 ml/m LA Biplane Vol: 54.6 ml 26.62 ml/m  AORTIC VALVE LVOT Vmax:   74.40 cm/s LVOT Vmean:  44.500 cm/s LVOT VTI:    0.128 m  AORTA Ao Root diam: 3.70 cm Ao Asc diam:  3.80 cm TRICUSPID VALVE TR Peak grad:   14.1 mmHg TR Vmax:        188.00 cm/s  SHUNTS Systemic VTI:  0.13 m Systemic Diam: 2.40 cm Riley Lam MD Electronically signed by Riley Lam MD Signature Date/Time: 12/24/2022/2:04:50 PM    Final    CARDIAC  CATHETERIZATION  Result Date: 12/24/2022 Successful transvenous pacemaker placement via right internal jugular access under fluoroscopic guidance. 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 Minimal left to right collaterals. Mild LV dysfunction with basal to mid inferior akinesis Normal LVEDP  TTE 4/16 1. Left ventricular ejection fraction, by estimation, is 40 to 45%. The  left ventricle has mildly decreased function. The left ventricle has no  regional wall motion abnormalities. There is mild concentric left  ventricular hypertrophy. Left ventricular  diastolic parameters are indeterminate.   2. Right ventricular systolic function mild to moderately reduced. The  right ventricular size is normal. Tricuspid regurgitation signal is  inadequate for assessing PA pressure.   3. The mitral valve is normal in structure. No evidence of mitral valve  regurgitation. No evidence of mitral stenosis.   4. The aortic valve is tricuspid. There is  mild thickening of the aortic  valve. Aortic valve regurgitation is not visualized. No aortic stenosis is  present.   5. The inferior vena cava is dilated in size with <50% respiratory  variability, suggesting right atrial pressure of 15 mmHg.   Patient Profile     57 y.o. male T2DM (poorly controlled), medical noncompliance, HTN, HL here with late presentation inferior STEMI (no PCI) now with AV block  Assessment & Plan    Late presentation STEMI: PCI deferred due to late presentation.  Continue Plavix, aspirin, statin, colchine, morphine for pain.  CP pain improving. Murmur:  New systolic murmur; will obtain TTE today to evaluate for mechanical complication (VSD/MR).  Hemodynamically stable. Cardiomyopathy:  Moderate LV dysfunction on TTE cont losartan; jardiance.  If gets PPM, will add Toprol AV block:  Temp wire in.  Demonstrating variable degrees of block; likely will need PPM (which would allow BB for LV dysfunction).  Patient would like to avoid PPM if possible (which I agree with) however if little possibility of resolution of AV block now 5-6 days after symptom initation per EP, then proceeding to PPM tomorrow may be an option.  Will d/w EP. Hypertension: Well afterload reduced. Hyperlipidemia: Continue high-dose atorvastatin Type 2 diabetes: Cont Jardiance, ASA, statin; on SSI insulin. AKI:  Likely due to initiation of losartan, monitor for now.  BMP in AM.  For questions or updates, please contact [facility] Please consult [email or web address] for contact info under   CRITICAL CARE Performed by: Alverda Skeans   Total critical care time: 30 minutes. Critical care time was exclusive of separately billable procedures and treating other patients. Critical care was necessary to treat or prevent imminent or life-threatening deterioration. Critical care was time spent personally by me on the following activities: development of treatment plan with patient and/or surrogate as well as nursing, discussions with consultants, evaluation of patient's response to treatment, examination of patient, obtaining history from patient or surrogate, ordering and performing treatments and interventions, ordering and review of laboratory studies, ordering and review of radiographic studies, pulse oximetry and re-evaluation of patient's condition.      Signed, Orbie Pyo, MD  12/26/2022, 6:42 AM

## 2022-12-26 NOTE — Progress Notes (Addendum)
Rounding Note    Patient Name: Ryan Petersen Date of Encounter: 12/26/2022  [facility] Cardiologist: None   Subjective   No CP this AM,  less and less CP, comfortable, no SOB, hopes to be able to get OOB today  Inpatient Medications    Scheduled Meds:  aspirin  81 mg Oral Daily   atorvastatin  80 mg Oral Daily   Chlorhexidine Gluconate Cloth  6 each Topical Daily   clopidogrel  75 mg Oral Daily   colchicine  0.6 mg Oral BID   empagliflozin  10 mg Oral Daily   gabapentin  600 mg Oral Q12H   insulin aspart  0-20 Units Subcutaneous TID WC   insulin aspart  0-5 Units Subcutaneous QHS   insulin aspart  4 Units Subcutaneous TID WC   insulin glargine-yfgn  15 Units Subcutaneous QHS   losartan  12.5 mg Oral QHS   sodium chloride flush  3 mL Intravenous Q12H   Continuous Infusions:  sodium chloride Stopped (12/24/22 0730)   sodium chloride     PRN Meds: sodium chloride, acetaminophen, morphine injection, ondansetron (ZOFRAN) IV, mouth rinse, sodium chloride flush, traMADol   Vital Signs    Vitals:   12/26/22 0530 12/26/22 0600 12/26/22 0630 12/26/22 0700  BP: 100/74 97/70 109/74   Pulse: (!) 56 (!) 57 (!) 53   Resp: (!) Temp:    98 F (36.7 C)  TempSrc:    Oral  SpO2: 96% 96% 94%   Weight:      Height:        Intake/Output Summary (Last 24 hours) at 12/26/2022 0733 Last data filed at 12/26/2022 0600 Gross per 24 hour  Intake 255.99 ml  Output 2025 ml  Net -1769.01 ml      12/23/2022   10:01 PM 10/15/2015   10:26 AM  Last 3 Weights  Weight (lbs) 192 lb 205 lb  Weight (kg) 87.091 kg 92.987 kg      Telemetry    SR, Mobitz one, 2:1, rates 50's-60's - Personally Reviewed  ECG    No new EKGs - Personally Reviewed  Physical Exam   GEN: No acute distress.   Neck: No JVD, R IJ site is stable Cardiac: regularly irregular, 2/6SM, no rubs, or gallops.  Respiratory: CTA b/l. GI: Soft, nontender, non-distended  MS: No edema; No  deformity. Neuro:  Nonfocal  Psych: Normal affect   Labs    High Sensitivity Troponin:   Recent Labs  Lab 12/23/22 2207 12/24/22 0047  TROPONINIHS 4,852* 4,524*     Chemistry Recent Labs  Lab 12/23/22 2210 12/24/22 0047 12/26/22 0052  NA 132* 133* 133*  K 3.8 3.9 3.7  CL 96* 95* 100  CO2 GLUCOSE 342* 358* 110*  BUN 25* 23* 35*  CREATININE 1.30* 1.41* 1.64*  CALCIUM 8.5* 8.6* 8.3*  MG  --  2.0  --   PROT 7.8  --   --   ALBUMIN 3.8  --   --   AST 53*  --   --   ALT 28  --   --   ALKPHOS 92  --   --   BILITOT 1.3*  --   --   GFRNONAA >60 58* 48*  ANIONGAP Lipids  Recent Labs  Lab 12/23/22 2210  CHOL 189  TRIG 106  HDL 56  LDLCALC NOT CALCULATED  CHOLHDL 3.4    Hematology Recent  Labs  Lab 12/23/22 2210 12/25/22 0051 12/26/22 0052  WBC 13.1* 13.0* 10.7*  RBC 4.49 3.94* 3.62*  HGB 13.1 11.5* 10.8*  HCT 38.7* 34.6* 32.0*  MCV 86.2 87.8 88.4  MCH 29.2 29.2 29.8  MCHC 33.9 33.2 33.8  RDW 12.5 12.7 12.7  PLT 284 248 271   Thyroid No results for input(s): "TSH", "FREET4" in the last 168 hours.  BNPNo results for input(s): "BNP", "PROBNP" in the last 168 hours.  DDimer No results for input(s): "DDIMER" in the last 168 hours.   Radiology      Cardiac Studies   12/24/22: TTE 1. Left ventricular ejection fraction, by estimation, is 40 to 45%. The  left ventricle has mildly decreased function. The left ventricle has no  regional wall motion abnormalities. There is mild concentric left  ventricular hypertrophy. Left ventricular  diastolic parameters are indeterminate.   2. Right ventricular systolic function mild to moderately reduced. The  right ventricular size is normal. Tricuspid regurgitation signal is  inadequate for assessing PA pressure.   3. The mitral valve is normal in structure. No evidence of mitral valve  regurgitation. No evidence of mitral stenosis.   4. The aortic valve is tricuspid. There is mild thickening of  the aortic  valve. Aortic valve regurgitation is not visualized. No aortic stenosis is  present.   5. The inferior vena cava is dilated in size with <50% respiratory  variability, suggesting right atrial pressure of 15 mmHg.   Comparison(s): No prior Echocardiogram   12/23/22: LHC 1st Mrg lesion is 70% stenosed.   Prox RCA to Dist RCA lesion is 100% stenosed.   There is mild left ventricular systolic dysfunction.   LV end diastolic pressure is normal.   The left ventricular ejection fraction is 50-55% by visual estimate.   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 Minimal left to right collaterals. Mild LV dysfunction with basal to mid inferior akinesis Normal LVEDP   Plan; patient presents with more than 48 hours of continuous chest pain. Now with minimal chest pressure. Findings c/w completed inferior infarct. I don't think there is an indication for reperfusion at this time. Will manage medically.   Patient Profile     57 y.o. male HTN, DM, HLD admitted with late presenting Inferior STEMI  Assessment & Plan    Variable heart block Mobitz one with 2:1 conduction No bradycardia/pacing noted (with back up at 40) S/p temp wire Threshold this AM is 1.5, left at 40bpm/34mA   S/p late presenting STEMI Cath with occl RCA > not intervened on given with more than 48 hours of continuous chest pain. Now with minimal chest pressure. Findings c/w completed inferior infarct.   Avoid nodal blocking agents Temp wire in stable Follow conduction as his clinical course evolves  If no improvement in conduction might look towards Friday, maybe next week to consider PPM    2.  SM on exam is new Echo this AM, ordered Hemodynamically stable, no SOB Drs Jannet Mantis aware     For questions or updates, please contact [facility] Please consult [email or web address] for contact info under        Signed, Sheilah Pigeon, PA-C  12/26/2022, 7:33 AM

## 2022-12-27 DIAGNOSIS — I442 Atrioventricular block, complete: Secondary | ICD-10-CM | POA: Diagnosis not present

## 2022-12-27 LAB — GLUCOSE, CAPILLARY
Glucose-Capillary: 77 mg/dL (ref 70–99)
Glucose-Capillary: 110 mg/dL — ABNORMAL HIGH (ref 70–99)
Glucose-Capillary: 146 mg/dL — ABNORMAL HIGH (ref 70–99)
Glucose-Capillary: 166 mg/dL — ABNORMAL HIGH (ref 70–99)

## 2022-12-27 LAB — BASIC METABOLIC PANEL
Anion gap: 11 (ref 5–15)
BUN: 34 mg/dL — ABNORMAL HIGH (ref 6–20)
CO2: 23 mmol/L (ref 22–32)
Calcium: 8.2 mg/dL — ABNORMAL LOW (ref 8.9–10.3)
Chloride: 99 mmol/L (ref 98–111)
Creatinine, Ser: 1.38 mg/dL — ABNORMAL HIGH (ref 0.61–1.24)
GFR, Estimated: 59 mL/min — ABNORMAL LOW (ref >60)
Glucose, Bld: 122 mg/dL — ABNORMAL HIGH (ref 70–99)
Potassium: 3.7 mmol/L (ref 3.5–5.1)
Sodium: 133 mmol/L — ABNORMAL LOW (ref 135–145)

## 2022-12-27 MED ORDER — SENNA 8.6 MG PO TABS: 1.0000 | ORAL_TABLET | Freq: Every day | ORAL | Status: DC | PRN

## 2022-12-27 NOTE — Progress Notes (Addendum)
Rounding Note    Patient Name: Ryan Petersen Date of Encounter: 12/27/2022  [facility] Cardiologist: None   Subjective   Feels well today, ambulated yesterday without difficulty, CP, or SOB  Inpatient Medications    Scheduled Meds:  aspirin  81 mg Oral Daily   atorvastatin  80 mg Oral Daily   Chlorhexidine Gluconate Cloth  6 each Topical Daily   clopidogrel  75 mg Oral Daily   colchicine  0.6 mg Oral BID   empagliflozin  10 mg Oral Daily   enoxaparin (LOVENOX) injection  40 mg Subcutaneous Q24H   gabapentin  600 mg Oral Q12H   insulin aspart  0-20 Units Subcutaneous TID WC   insulin aspart  0-5 Units Subcutaneous QHS   insulin aspart  4 Units Subcutaneous TID WC   insulin glargine-yfgn  15 Units Subcutaneous QHS   losartan  12.5 mg Oral QHS   sodium chloride flush  3 mL Intravenous Q12H   Continuous Infusions:  sodium chloride 10 mL/hr at 12/27/22 0820   sodium chloride 250 mL (12/27/22 0821)   PRN Meds: sodium chloride, acetaminophen, morphine injection, ondansetron (ZOFRAN) IV, mouth rinse, sodium chloride flush, traMADol   Vital Signs    Vitals:   12/27/22 0500 12/27/22 0600 12/27/22 0700 12/27/22 0800  BP: 126/68 117/73    Pulse: 60 63    Resp: (!) 21 16    Temp:   100.1 F (37.8 C) 98.4 F (36.9 C)  TempSrc:   Oral   SpO2: 93% 91%    Weight:      Height:        Intake/Output Summary (Last 24 hours) at 12/27/2022 0833 Last data filed at 12/27/2022 0800 Gross per 24 hour  Intake 240 ml  Output 1625 ml  Net -1385 ml      12/23/2022   10:01 PM 10/15/2015   10:26 AM  Last 3 Weights  Weight (lbs) 192 lb 205 lb  Weight (kg) 87.091 kg 92.987 kg      Telemetry    SR, Mobitz one, 2:1, rates 60's-90's with longer periods of 1:1 conduction- Personally Reviewed  ECG    No new EKGs - Personally Reviewed  Physical Exam   GEN: No acute distress.   Neck: No JVD, R IJ site is stable Cardiac: regularly irregular, 2/6SM/DM, no rubs, or  gallops.  Respiratory: CTA b/l. GI: Soft, nontender, non-distended  MS: No edema; No deformity. Neuro:  Nonfocal  Psych: Normal affect   Labs    High Sensitivity Troponin:   Recent Labs  Lab 12/23/22 2207 12/24/22 0047  TROPONINIHS 4,852* 4,524*     Chemistry Recent Labs  Lab 12/23/22 2210 12/24/22 0047 12/26/22 0052 12/27/22 0109  NA 132* 133* 133* 133*  K 3.8 3.9 3.7 3.7  CL 96* 95* 100 99  CO2 25 23 23 23   GLUCOSE 342* 358* 110* 122*  BUN 25* 23* 35* 34*  CREATININE 1.30* 1.41* 1.64* 1.38*  CALCIUM 8.5* 8.6* 8.3* 8.2*  MG  --  2.0  --   --   PROT 7.8  --   --   --   ALBUMIN 3.8  --   --   --   AST 53*  --   --   --   ALT 28  --   --   --   ALKPHOS 92  --   --   --   BILITOT 1.3*  --   --   --   GFRNONAA >  60 58* 48* 59*  ANIONGAP Lipids  Recent Labs  Lab 12/23/22 2210  CHOL 189  TRIG 106  HDL 56  LDLCALC NOT CALCULATED  CHOLHDL 3.4    Hematology Recent Labs  Lab 12/23/22 2210 12/25/22 0051 12/26/22 0052  WBC 13.1* 13.0* 10.7*  RBC 4.49 3.94* 3.62*  HGB 13.1 11.5* 10.8*  HCT 38.7* 34.6* 32.0*  MCV 86.2 87.8 88.4  MCH 29.2 29.2 29.8  MCHC 33.9 33.2 33.8  RDW 12.5 12.7 12.7  PLT 284 248 271   Thyroid No results for input(s): "TSH", "FREET4" in the last 168 hours.  BNPNo results for input(s): "BNP", "PROBNP" in the last 168 hours.  DDimer No results for input(s): "DDIMER" in the last 168 hours.   Radiology      Cardiac Studies   12/26/22: TTE  1. Left ventricular ejection fraction, by estimation, is 55 to 60%. Left  ventricular ejection fraction by 2D MOD biplane is 56.5 %. The left  ventricle has normal function. The left ventricle demonstrates regional  wall motion abnormalities (see scoring  diagram/findings for description). There is mild left ventricular  hypertrophy. There is moderate hypokinesis of the left ventricular,  mid-apical inferoseptal wall and inferior wall.   2. The mitral valve is abnormal. Trivial  mitral valve regurgitation.   3. The aortic valve is tricuspid. Aortic valve regurgitation is not  visualized. No aortic stenosis is present.   4. Aortic dilatation noted. There is mild dilatation of the aortic root,  measuring 41 mm. There is mild dilatation of the ascending aorta,  measuring 44 mm.   5. There is normal pulmonary artery systolic pressure. The estimated  right ventricular systolic pressure is 22.4 mmHg.   6. The inferior vena cava is normal in size with <50% respiratory  variability, suggesting right atrial pressure of 8 mmHg.   Comparison(s): Changes from prior study are noted. 12/24/2022: LVEF 40-45%.   Conclusion(s)/Recommendation(s): No evidence for VSD or mechanical  complication of MI - LVEF has improved.    12/24/22: TTE 1. Left ventricular ejection fraction, by estimation, is 40 to 45%. The  left ventricle has mildly decreased function. The left ventricle has no  regional wall motion abnormalities. There is mild concentric left  ventricular hypertrophy. Left ventricular  diastolic parameters are indeterminate.   2. Right ventricular systolic function mild to moderately reduced. The  right ventricular size is normal. Tricuspid regurgitation signal is  inadequate for assessing PA pressure.   3. The mitral valve is normal in structure. No evidence of mitral valve  regurgitation. No evidence of mitral stenosis.   4. The aortic valve is tricuspid. There is mild thickening of the aortic  valve. Aortic valve regurgitation is not visualized. No aortic stenosis is  present.   5. The inferior vena cava is dilated in size with <50% respiratory  variability, suggesting right atrial pressure of 15 mmHg.   Comparison(s): No prior Echocardiogram   12/23/22: LHC 1st Mrg lesion is 70% stenosed.   Prox RCA to Dist RCA lesion is 100% stenosed.   There is mild left ventricular systolic dysfunction.   LV end diastolic pressure is normal.   The left ventricular ejection  fraction is 50-55% by visual estimate.   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 Minimal left to right collaterals. Mild LV dysfunction with basal to mid inferior akinesis Normal LVEDP   Plan; patient presents with more than 48 hours of continuous chest pain. Now  with minimal chest pressure. Findings c/w completed inferior infarct. I don't think there is an indication for reperfusion at this time. Will manage medically.   Patient Profile     57 y.o. male HTN, DM, HLD admitted with late presenting Inferior STEMI  Assessment & Plan    Variable heart block Mobitz one with 2:1 conduction No bradycardia/pacing noted (with back up at 40) S/p temp wire Threshold this AM is again 1.5, left at 40bpm/11mA   S/p late presenting STEMI Cath with occl RCA > not intervened on given with more than 48 hours of continuous chest pain. Now with minimal chest pressure. Findings c/w completed inferior infarct.   Avoid nodal blocking agents Temp wire in stable Follow conduction as his clinical course evolves He seems to be having conduction system recovery  If no ongoing improvement in conduction may need PPM, maybe next week NPO after MN Sunday night for now    2.  SM/DM remains Hemodynamically stable, no SOB Echo yesterday with some MR felt to be ischemic, no pap rupture/shunting Likely a rub   Addend; Dr. Lalla Brothers has seen the patient OK to pull temp wire from EP perspective, though defer to attending cardiology team    For questions or updates, please contact [facility] Please consult [email or web address] for contact info under        Signed, Sheilah Pigeon, PA-C  12/27/2022, 8:33 AM

## 2022-12-27 NOTE — Progress Notes (Addendum)
Rounding Note    Patient Name: Ryan Petersen Date of Encounter: 12/27/2022  [facility] Cardiologist: [place]  Subjective   No acute events overnight  No pacing overnight  Tele now with second degree mobitz 1 block  Chest pain somewhat improved  Inpatient Medications    Scheduled Meds:  aspirin  81 mg Oral Daily   atorvastatin  80 mg Oral Daily   Chlorhexidine Gluconate Cloth  6 each Topical Daily   clopidogrel  75 mg Oral Daily   colchicine  0.6 mg Oral BID   empagliflozin  10 mg Oral Daily   enoxaparin (LOVENOX) injection  40 mg Subcutaneous Q24H   gabapentin  600 mg Oral Q12H   insulin aspart  0-20 Units Subcutaneous TID WC   insulin aspart  0-5 Units Subcutaneous QHS   insulin aspart  4 Units Subcutaneous TID WC   insulin glargine-yfgn  15 Units Subcutaneous QHS   losartan  12.5 mg Oral QHS   sodium chloride flush  3 mL Intravenous Q12H   Continuous Infusions:  sodium chloride Stopped (12/24/22 0730)   sodium chloride     PRN Meds: sodium chloride, acetaminophen, morphine injection, ondansetron (ZOFRAN) IV, mouth rinse, sodium chloride flush, traMADol   Vital Signs    Vitals:   12/27/22 0400 12/27/22 0500 12/27/22 0600 12/27/22 0700  BP: 115/69 126/68 117/73   Pulse: 64 60 63   Resp: 17 (!) 21 16   Temp:    100.1 F (37.8 C)  TempSrc:    Oral  SpO2: 93% 93% 91%   Weight:      Height:        Intake/Output Summary (Last 24 hours) at 12/27/2022 0750 Last data filed at 12/27/2022 0500 Gross per 24 hour  Intake 360 ml  Output 1575 ml  Net -1215 ml      12/23/2022   10:01 PM 10/15/2015   10:26 AM  Last 3 Weights  Weight (lbs) 192 lb 205 lb  Weight (kg) 87.091 kg 92.987 kg      Telemetry    Second degree Mobitz I - Personally Reviewed  ECG    Second degree Mobitz I- Personally Reviewed  Physical Exam   GEN: No acute distress.   Neck: No JVD Cardiac: RRR, + short systolic murmur, no rubs, or gallops.  Respiratory: Clear to  auscultation bilaterally. GI: Soft, nontender, non-distended  MS: No edema; No deformity. Neuro:  Nonfocal  Psych: Normal affect   Labs    High Sensitivity Troponin:   Recent Labs  Lab 12/23/22 2207 12/24/22 0047  TROPONINIHS 4,852* 4,524*     Chemistry Recent Labs  Lab 12/23/22 2210 12/24/22 0047 12/26/22 0052 12/27/22 0109  NA 132* 133* 133* 133*  K 3.8 3.9 3.7 3.7  CL 96* 95* 100 99  CO2 25 23 23 23   GLUCOSE 342* 358* 110* 122*  BUN 25* 23* 35* 34*  CREATININE 1.30* 1.41* 1.64* 1.38*  CALCIUM 8.5* 8.6* 8.3* 8.2*  MG  --  2.0  --   --   PROT 7.8  --   --   --   ALBUMIN 3.8  --   --   --   AST 53*  --   --   --   ALT 28  --   --   --   ALKPHOS 92  --   --   --   BILITOT 1.3*  --   --   --   GFRNONAA >60 58* 48* 59*  ANIONGAP 11 15 10 11     Lipids  Recent Labs  Lab 12/23/22 2210  CHOL 189  TRIG 106  HDL 56  LDLCALC NOT CALCULATED  CHOLHDL 3.4    Hematology Recent Labs  Lab 12/23/22 2210 12/25/22 0051 12/26/22 0052  WBC 13.1* 13.0* 10.7*  RBC 4.49 3.94* 3.62*  HGB 13.1 11.5* 10.8*  HCT 38.7* 34.6* 32.0*  MCV 86.2 87.8 88.4  MCH 29.2 29.2 29.8  MCHC 33.9 33.2 33.8  RDW 12.5 12.7 12.7  PLT 284 248 271   Thyroid No results for input(s): "TSH", "FREET4" in the last 168 hours.  BNPNo results for input(s): "BNP", "PROBNP" in the last 168 hours.  DDimer No results for input(s): "DDIMER" in the last 168 hours.   Radiology    ECHOCARDIOGRAM LIMITED  Result Date: 12/26/2022    ECHOCARDIOGRAM LIMITED REPORT   Patient Name:   Ryan Petersen Date of Exam: 12/26/2022 Medical Rec #:  [id number]     Height:       70.0 in Accession #:    [id number]    Weight:       192.0 lb Date of Birth:  Jun 15, 1966     BSA:          2.051 m Patient Age:    57 years      BP:           111/74 mmHg Patient Gender: M             HR:           62 bpm. Exam Location:  Inpatient Procedure: 2D Echo, Cardiac Doppler and Color Doppler Indications:    murmur/Acute MI  History:         Patient has prior history of Echocardiogram examinations, most                 recent 12/24/2022. Acute MI; Risk Factors:Hypertension and                 Dyslipidemia.  Sonographer:    Wallie Char Referring Phys: [id number] RENEE LYNN URSUY IMPRESSIONS  1. Left ventricular ejection fraction, by estimation, is 55 to 60%. Left ventricular ejection fraction by 2D MOD biplane is 56.5 %. The left ventricle has normal function. The left ventricle demonstrates regional wall motion abnormalities (see scoring diagram/findings for description). There is mild left ventricular hypertrophy. There is moderate hypokinesis of the left ventricular, mid-apical inferoseptal wall and inferior wall.  2. The mitral valve is abnormal. Trivial mitral valve regurgitation.  3. The aortic valve is tricuspid. Aortic valve regurgitation is not visualized. No aortic stenosis is present.  4. Aortic dilatation noted. There is mild dilatation of the aortic root, measuring 41 mm. There is mild dilatation of the ascending aorta, measuring 44 mm.  5. There is normal pulmonary artery systolic pressure. The estimated right ventricular systolic pressure is 22.4 mmHg.  6. The inferior vena cava is normal in size with <50% respiratory variability, suggesting right atrial pressure of 8 mmHg. Comparison(s): Changes from prior study are noted. 12/24/2022: LVEF 40-45%. Conclusion(s)/Recommendation(s): No evidence for VSD or mechanical complication of MI - LVEF has improved. FINDINGS  Left Ventricle: Left ventricular ejection fraction, by estimation, is 55 to 60%. Left ventricular ejection fraction by 2D MOD biplane is 56.5 %. The left ventricle has normal function. The left ventricle demonstrates regional wall motion abnormalities. Moderate hypokinesis of the left ventricular, mid-apical inferoseptal wall and inferior wall. The left ventricular internal cavity size was normal in  size. There is mild left ventricular hypertrophy. Right Ventricle: There is normal  pulmonary artery systolic pressure. The tricuspid regurgitant velocity is 1.90 m/s, and with an assumed right atrial pressure of 8 mmHg, the estimated right ventricular systolic pressure is 22.4 mmHg. Pericardium: There is no evidence of pericardial effusion. Mitral Valve: The mitral valve is abnormal. There is mild thickening of the anterior and posterior mitral valve leaflet(s). Trivial mitral valve regurgitation. MV peak gradient, 5.0 mmHg. The mean mitral valve gradient is 1.0 mmHg. Tricuspid Valve: The tricuspid valve is grossly normal. Tricuspid valve regurgitation is trivial. Aortic Valve: The aortic valve is tricuspid. Aortic valve regurgitation is not visualized. No aortic stenosis is present. Aortic valve mean gradient measures 3.0 mmHg. Aortic valve peak gradient measures 4.8 mmHg. Aortic valve area, by VTI measures 4.23 cm. Pulmonic Valve: The pulmonic valve was grossly normal. Pulmonic valve regurgitation is trivial. Aorta: Aortic dilatation noted. There is mild dilatation of the aortic root, measuring 41 mm. There is mild dilatation of the ascending aorta, measuring 44 mm. Venous: The inferior vena cava is normal in size with less than 50% respiratory variability, suggesting right atrial pressure of 8 mmHg. LEFT VENTRICLE PLAX 2D                        Biplane EF (MOD) LVIDd:         5.30 cm         LV Biplane EF:   Left LVIDs:         3.50 cm                          ventricular LV PW:         1.20 cm                          ejection LV IVS:        1.20 cm                          fraction by LVOT diam:     2.40 cm                          2D MOD LV SV:         90                               biplane is LV SV Index:   44                               56.5 %. LVOT Area:     4.52 cm  LV Volumes (MOD) LV vol d, MOD    96.4 ml A2C: LV vol d, MOD    111.0 ml A4C: LV vol s, MOD    42.2 ml A2C: LV vol s, MOD    48.2 ml A4C: LV SV MOD A2C:   54.2 ml LV SV MOD A4C:   111.0 ml LV SV MOD BP:    59.5 ml RIGHT  VENTRICLE         IVC TAPSE (M-mode): 1.8 cm  IVC diam: 2.00 cm LEFT ATRIUM             Index  RIGHT ATRIUM           Index LA diam:        3.60 cm 1.75 cm/m   RA Area:     15.70 cm LA Vol (A2C):   36.4 ml 17.74 ml/m  RA Volume:   41.30 ml  20.13 ml/m LA Vol (A4C):   33.6 ml 16.38 ml/m LA Biplane Vol: 35.0 ml 17.06 ml/m  AORTIC VALVE AV Area (Vmax):    4.15 cm AV Area (Vmean):   4.41 cm AV Area (VTI):     4.23 cm AV Vmax:           110.00 cm/s AV Vmean:          78.600 cm/s AV VTI:            0.213 m AV Peak Grad:      4.8 mmHg AV Mean Grad:      3.0 mmHg LVOT Vmax:         101.00 cm/s LVOT Vmean:        76.600 cm/s LVOT VTI:          0.199 m LVOT/AV VTI ratio: 0.93  AORTA Ao Root diam: 4.10 cm Ao Asc diam:  4.40 cm MITRAL VALVE               TRICUSPID VALVE MV Area (PHT): 4.36 cm    TR Peak grad:   14.4 mmHg MV Area VTI:   2.58 cm    TR Vmax:        190.00 cm/s MV Peak grad:  5.0 mmHg MV Mean grad:  1.0 mmHg    SHUNTS MV Vmax:       1.12 m/s    Systemic VTI:  0.20 m MV Vmean:      44.4 cm/s   Systemic Diam: 2.40 cm MV Decel Time: 174 msec MV E velocity: 99.20 cm/s MV A velocity: 67.90 cm/s MV E/A ratio:  1.46 Zoila Shutter MD Electronically signed by Zoila Shutter MD Signature Date/Time: 12/26/2022/11:37:40 AM    Final     Cardiac Studies   Coronary angiography 4/15 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 Minimal left to right collaterals. Mild LV dysfunction with basal to mid inferior akinesis Normal LVEDP  TTE 4/16 1. Left ventricular ejection fraction, by estimation, is 40 to 45%. The  left ventricle has mildly decreased function. The left ventricle has no  regional wall motion abnormalities. There is mild concentric left  ventricular hypertrophy. Left ventricular  diastolic parameters are indeterminate.   2. Right ventricular systolic function mild to moderately reduced. The  right ventricular size is normal. Tricuspid regurgitation signal is  inadequate for assessing  PA pressure.   3. The mitral valve is normal in structure. No evidence of mitral valve  regurgitation. No evidence of mitral stenosis.   4. The aortic valve is tricuspid. There is mild thickening of the aortic  valve. Aortic valve regurgitation is not visualized. No aortic stenosis is  present.   5. The inferior vena cava is dilated in size with <50% respiratory  variability, suggesting right atrial pressure of 15 mmHg.   Patient Profile     58 y.o. male T2DM (poorly controlled), medical noncompliance, HTN, HL here with late presentation inferior STEMI (no PCI) now with AV block  Assessment & Plan    Late presentation STEMI: PCI deferred due to late presentation.  Continue Plavix, aspirin, statin, colchine, morphine for pain.  CP much improved this AM Murmur:  Reviewed limited TTE from yesterday.  Has mild MR, infarct related.  Murmur is short systolic in nature. Cardiomyopathy:  EF improved on TTE yesterday. AV block: Temp wire in.  D/W EP yesterday, planning for PPM probably Monday Hypertension: Well afterload reduced.   Hyperlipidemia: Continue high-dose atorvastatin Type 2 diabetes: Cont Jardiance, ASA, statin; on SSI insulin. AKI:  Improving, monitor  For questions or updates, please contact [facility] Please consult [email or web address] for contact info under   CRITICAL CARE Performed by: Alverda Skeans   Total critical care time: 30 minutes. Critical care time was exclusive of separately billable procedures and treating other patients. Critical care was necessary to treat or prevent imminent or life-threatening deterioration. Critical care was time spent personally by me on the following activities: development of treatment plan with patient and/or surrogate as well as nursing, discussions with consultants, evaluation of patient's response to treatment, examination of patient, obtaining history from patient or surrogate, ordering and performing treatments and interventions,  ordering and review of laboratory studies, ordering and review of radiographic studies, pulse oximetry and re-evaluation of patient's condition.      Signed, Orbie Pyo, MD  12/27/2022, 7:50 AM     ADDENDUM Patient now with 1:1 conduction.  Will keep TVP in overnight.  If stable rhythm in AM, will d/c TVP.  Alverda Skeans, MD Pager [phone]

## 2022-12-28 ENCOUNTER — Other Ambulatory Visit: Payer: Self-pay

## 2022-12-28 DIAGNOSIS — I110 Hypertensive heart disease with heart failure: Secondary | ICD-10-CM | POA: Diagnosis not present

## 2022-12-28 DIAGNOSIS — I5021 Acute systolic (congestive) heart failure: Secondary | ICD-10-CM | POA: Diagnosis not present

## 2022-12-28 DIAGNOSIS — I442 Atrioventricular block, complete: Secondary | ICD-10-CM | POA: Diagnosis not present

## 2022-12-28 DIAGNOSIS — I2111 ST elevation (STEMI) myocardial infarction involving right coronary artery: Secondary | ICD-10-CM | POA: Diagnosis not present

## 2022-12-28 LAB — GLUCOSE, CAPILLARY
Glucose-Capillary: 94 mg/dL (ref 70–99)
Glucose-Capillary: 156 mg/dL — ABNORMAL HIGH (ref 70–99)
Glucose-Capillary: 112 mg/dL — ABNORMAL HIGH (ref 70–99)
Glucose-Capillary: 126 mg/dL — ABNORMAL HIGH (ref 70–99)

## 2022-12-28 LAB — BASIC METABOLIC PANEL
Anion gap: 10 (ref 5–15)
BUN: 27 mg/dL — ABNORMAL HIGH (ref 6–20)
CO2: 23 mmol/L (ref 22–32)
Calcium: 8.6 mg/dL — ABNORMAL LOW (ref 8.9–10.3)
Chloride: 99 mmol/L (ref 98–111)
Creatinine, Ser: 1.35 mg/dL — ABNORMAL HIGH (ref 0.61–1.24)
GFR, Estimated: >60 mL/min (ref >60)
Glucose, Bld: 147 mg/dL — ABNORMAL HIGH (ref 70–99)
Potassium: 3.9 mmol/L (ref 3.5–5.1)
Sodium: 132 mmol/L — ABNORMAL LOW (ref 135–145)

## 2022-12-28 MED ORDER — POTASSIUM CHLORIDE CRYS ER 20 MEQ PO TBCR
20.0000 meq | EXTENDED_RELEASE_TABLET | Freq: Once | ORAL | Status: AC
  Administered 2022-12-28: 20 meq via ORAL
  Filled 2022-12-28: qty 1

## 2022-12-28 NOTE — Progress Notes (Signed)
16fr pacing wire removed from right jugular vein, 50fr sheath aspirated and removed. Manual pressure applied for 10 minutes. Site level 0 no S+S of bleeding or hematoma. Petroleum gauze used under tegaderm dressing.   Post removal instructions given.

## 2022-12-28 NOTE — Progress Notes (Signed)
Rounding Note    Patient Name: Ryan Petersen Date of Encounter: 12/28/2022  [facility] Cardiologist: [place]  Subjective   No acute events overnight  No pacing overnight  Tele with SR  Chest pain improved, 1-2/10 but none with activity.   Spoke to wife and daughter on phone in the room.   Inpatient Medications    Scheduled Meds:  aspirin  81 mg Oral Daily   atorvastatin  80 mg Oral Daily   Chlorhexidine Gluconate Cloth  6 each Topical Daily   clopidogrel  75 mg Oral Daily   colchicine  0.6 mg Oral BID   empagliflozin  10 mg Oral Daily   enoxaparin (LOVENOX) injection  40 mg Subcutaneous Q24H   gabapentin  600 mg Oral Q12H   insulin aspart  0-20 Units Subcutaneous TID WC   insulin aspart  0-5 Units Subcutaneous QHS   insulin aspart  4 Units Subcutaneous TID WC   insulin glargine-yfgn  15 Units Subcutaneous QHS   losartan  12.5 mg Oral QHS   sodium chloride flush  3 mL Intravenous Q12H   Continuous Infusions:  sodium chloride 10 mL/hr at 12/28/22 0600   sodium chloride Stopped (12/27/22 1426)   PRN Meds: sodium chloride, acetaminophen, morphine injection, ondansetron (ZOFRAN) IV, mouth rinse, senna, sodium chloride flush, traMADol   Vital Signs    Vitals:   12/28/22 0500 12/28/22 0600 12/28/22 0635 12/28/22 0700  BP: 125/89 120/83    Pulse:      Resp: 16 13    Temp:   98.7 F (37.1 C) 98.7 F (37.1 C)  TempSrc:   Oral Oral  SpO2:      Weight:      Height:        Intake/Output Summary (Last 24 hours) at 12/28/2022 0817 Last data filed at 12/28/2022 0600 Gross per 24 hour  Intake 637.27 ml  Output 2575 ml  Net -1937.73 ml      12/23/2022   10:01 PM 10/15/2015   10:26 AM  Last 3 Weights  Weight (lbs) 192 lb 205 lb  Weight (kg) 87.091 kg 92.987 kg      Telemetry    Second degree Mobitz I until 12/27/22 at noon, then SR with no AV block noted - Personally Reviewed  ECG    No new 12/24/22 mobitz I with persistent ST elevations -  Personally Reviewed  Physical Exam   GEN: No acute distress.   Neck: No JVD Cardiac: RRR, faint systolic murmur, no rubs, or gallops.  Respiratory: Clear to auscultation bilaterally. GI: Soft, nontender, non-distended  MS: No edema; No deformity. Neuro:  Nonfocal  Psych: Normal affect   Labs    High Sensitivity Troponin:   Recent Labs  Lab 12/23/22 2207 12/24/22 0047  TROPONINIHS 4,852* 4,524*     Chemistry Recent Labs  Lab 12/23/22 2210 12/24/22 0047 12/26/22 0052 12/27/22 0109 12/28/22 0200  NA 132* 133* 133* 133* 132*  K 3.8 3.9 3.7 3.7 3.9  CL 96* 95* 100 99 99  CO2 25 23 23 23 23   GLUCOSE 342* 358* 110* 122* 147*  BUN 25* 23* 35* 34* 27*  CREATININE 1.30* 1.41* 1.64* 1.38* 1.35*  CALCIUM 8.5* 8.6* 8.3* 8.2* 8.6*  MG  --  2.0  --   --   --   PROT 7.8  --   --   --   --   ALBUMIN 3.8  --   --   --   --  AST 53*  --   --   --   --   ALT 28  --   --   --   --   ALKPHOS 92  --   --   --   --   BILITOT 1.3*  --   --   --   --   GFRNONAA >60 58* 48* 59* >60  ANIONGAP Lipids  Recent Labs  Lab 12/23/22 2210  CHOL 189  TRIG 106  HDL 56  LDLCALC NOT CALCULATED  CHOLHDL 3.4    Hematology Recent Labs  Lab 12/23/22 2210 12/25/22 0051 12/26/22 0052  WBC 13.1* 13.0* 10.7*  RBC 4.49 3.94* 3.62*  HGB 13.1 11.5* 10.8*  HCT 38.7* 34.6* 32.0*  MCV 86.2 87.8 88.4  MCH 29.2 29.2 29.8  MCHC 33.9 33.2 33.8  RDW 12.5 12.7 12.7  PLT 284 248 271   Thyroid No results for input(s): "TSH", "FREET4" in the last 168 hours.  BNPNo results for input(s): "BNP", "PROBNP" in the last 168 hours.  DDimer No results for input(s): "DDIMER" in the last 168 hours.   Radiology    ECHOCARDIOGRAM LIMITED  Result Date: 12/26/2022    ECHOCARDIOGRAM LIMITED REPORT   Patient Name:   Ryan Petersen Date of Exam: 12/26/2022 Medical Rec #:  [id number]     Height:       70.0 in Accession #:    [id number]    Weight:       192.0 lb Date of Birth:  1965/11/07     BSA:           2.051 m Patient Age:    57 years      BP:           111/74 mmHg Patient Gender: M             HR:           62 bpm. Exam Location:  Inpatient Procedure: 2D Echo, Cardiac Doppler and Color Doppler Indications:    murmur/Acute MI  History:        Patient has prior history of Echocardiogram examinations, most                 recent 12/24/2022. Acute MI; Risk Factors:Hypertension and                 Dyslipidemia.  Sonographer:    Wallie Char Referring Phys: [id number] RENEE LYNN URSUY IMPRESSIONS  1. Left ventricular ejection fraction, by estimation, is 55 to 60%. Left ventricular ejection fraction by 2D MOD biplane is 56.5 %. The left ventricle has normal function. The left ventricle demonstrates regional wall motion abnormalities (see scoring diagram/findings for description). There is mild left ventricular hypertrophy. There is moderate hypokinesis of the left ventricular, mid-apical inferoseptal wall and inferior wall.  2. The mitral valve is abnormal. Trivial mitral valve regurgitation.  3. The aortic valve is tricuspid. Aortic valve regurgitation is not visualized. No aortic stenosis is present.  4. Aortic dilatation noted. There is mild dilatation of the aortic root, measuring 41 mm. There is mild dilatation of the ascending aorta, measuring 44 mm.  5. There is normal pulmonary artery systolic pressure. The estimated right ventricular systolic pressure is 22.4 mmHg.  6. The inferior vena cava is normal in size with <50% respiratory variability, suggesting right atrial pressure of 8 mmHg. Comparison(s): Changes from prior study are noted. 12/24/2022: LVEF 40-45%. Conclusion(s)/Recommendation(s): No evidence for VSD or  mechanical complication of MI - LVEF has improved. FINDINGS  Left Ventricle: Left ventricular ejection fraction, by estimation, is 55 to 60%. Left ventricular ejection fraction by 2D MOD biplane is 56.5 %. The left ventricle has normal function. The left ventricle demonstrates regional wall motion  abnormalities. Moderate hypokinesis of the left ventricular, mid-apical inferoseptal wall and inferior wall. The left ventricular internal cavity size was normal in size. There is mild left ventricular hypertrophy. Right Ventricle: There is normal pulmonary artery systolic pressure. The tricuspid regurgitant velocity is 1.90 m/s, and with an assumed right atrial pressure of 8 mmHg, the estimated right ventricular systolic pressure is 22.4 mmHg. Pericardium: There is no evidence of pericardial effusion. Mitral Valve: The mitral valve is abnormal. There is mild thickening of the anterior and posterior mitral valve leaflet(s). Trivial mitral valve regurgitation. MV peak gradient, 5.0 mmHg. The mean mitral valve gradient is 1.0 mmHg. Tricuspid Valve: The tricuspid valve is grossly normal. Tricuspid valve regurgitation is trivial. Aortic Valve: The aortic valve is tricuspid. Aortic valve regurgitation is not visualized. No aortic stenosis is present. Aortic valve mean gradient measures 3.0 mmHg. Aortic valve peak gradient measures 4.8 mmHg. Aortic valve area, by VTI measures 4.23 cm. Pulmonic Valve: The pulmonic valve was grossly normal. Pulmonic valve regurgitation is trivial. Aorta: Aortic dilatation noted. There is mild dilatation of the aortic root, measuring 41 mm. There is mild dilatation of the ascending aorta, measuring 44 mm. Venous: The inferior vena cava is normal in size with less than 50% respiratory variability, suggesting right atrial pressure of 8 mmHg. LEFT VENTRICLE PLAX 2D                        Biplane EF (MOD) LVIDd:         5.30 cm         LV Biplane EF:   Left LVIDs:         3.50 cm                          ventricular LV PW:         1.20 cm                          ejection LV IVS:        1.20 cm                          fraction by LVOT diam:     2.40 cm                          2D MOD LV SV:         90                               biplane is LV SV Index:   44                               56.5  %. LVOT Area:     4.52 cm  LV Volumes (MOD) LV vol d, MOD    96.4 ml A2C: LV vol d, MOD    111.0 ml A4C: LV vol s, MOD    42.2 ml A2C: LV vol s, MOD  48.2 ml A4C: LV SV MOD A2C:   54.2 ml LV SV MOD A4C:   111.0 ml LV SV MOD BP:    59.5 ml RIGHT VENTRICLE         IVC TAPSE (M-mode): 1.8 cm  IVC diam: 2.00 cm LEFT ATRIUM             Index        RIGHT ATRIUM           Index LA diam:        3.60 cm 1.75 cm/m   RA Area:     15.70 cm LA Vol (A2C):   36.4 ml 17.74 ml/m  RA Volume:   41.30 ml  20.13 ml/m LA Vol (A4C):   33.6 ml 16.38 ml/m LA Biplane Vol: 35.0 ml 17.06 ml/m  AORTIC VALVE AV Area (Vmax):    4.15 cm AV Area (Vmean):   4.41 cm AV Area (VTI):     4.23 cm AV Vmax:           110.00 cm/s AV Vmean:          78.600 cm/s AV VTI:            0.213 m AV Peak Grad:      4.8 mmHg AV Mean Grad:      3.0 mmHg LVOT Vmax:         101.00 cm/s LVOT Vmean:        76.600 cm/s LVOT VTI:          0.199 m LVOT/AV VTI ratio: 0.93  AORTA Ao Root diam: 4.10 cm Ao Asc diam:  4.40 cm MITRAL VALVE               TRICUSPID VALVE MV Area (PHT): 4.36 cm    TR Peak grad:   14.4 mmHg MV Area VTI:   2.58 cm    TR Vmax:        190.00 cm/s MV Peak grad:  5.0 mmHg MV Mean grad:  1.0 mmHg    SHUNTS MV Vmax:       1.12 m/s    Systemic VTI:  0.20 m MV Vmean:      44.4 cm/s   Systemic Diam: 2.40 cm MV Decel Time: 174 msec MV E velocity: 99.20 cm/s MV A velocity: 67.90 cm/s MV E/A ratio:  1.46 Zoila Shutter MD Electronically signed by Zoila Shutter MD Signature Date/Time: 12/26/2022/11:37:40 AM    Final     Cardiac Studies   Coronary angiography 4/15 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 Minimal left to right collaterals. Mild LV dysfunction with basal to mid inferior akinesis Normal LVEDP  4/18 TTE personal interpretation: Preserved LV function with inferior RWMA. RV mildly reduced fx and mildly enlarged. MR is mild in setting of RWMA. Tricuspid AV.  Patient Profile     57 y.o. male T2DM (poorly controlled),  medical noncompliance, HTN, HL here with late presentation inferior STEMI (no PCI) now with AV block.   Assessment & Plan    Late presentation STEMI: >48 hours from chest pain onsent, completed inferior infarct Occluded RCA with minimal collaterals. PCI deferred due to late presentation.  Continue Plavix, aspirin, statin, colchine, morphine for pain.  Persistent ST elevations on tele - no obvious LV aneurysm at site of wall motion abnormalities. Observe. Murmur:  personally reviewed TTE 4/18. MR mild, likely ischemic as mechanism due to RWMA. Cardiomyopathy:  EF improved on TTE yesterday. RWMA persist in LV, right  ventricle is mildly reduced fx and mildly enlarged. AV block: Temp wire in.  Can likely remove today. Keep in hospital for tele monitoring until Monday, EP to comment on final need for PPM but looks less likely. Hypertension: Well afterload reduced.  BP normal Hyperlipidemia: Continue high-dose atorvastatin Type 2 diabetes: Cont Jardiance, ASA, statin; on SSI insulin. AKI:  stable today, 1.38 >> 1.35  For questions or updates, please contact [facility] Please consult [email or web address] for contact info under   CRITICAL CARE Performed by: Weston Brass, MD   Total critical care time: 45 minutes   Critical care time was exclusive of separately billable procedures and treating other patients.   Critical care was necessary to treat or prevent imminent or life-threatening deterioration.   Critical care was time spent personally by me (independent of APPs or residents) on the following activities: development of treatment plan with patient and/or surrogate as well as nursing, discussions with consultants, evaluation of patient's response to treatment, examination of patient, obtaining history from patient or surrogate, ordering and performing treatments and interventions, ordering and review of laboratory studies, ordering and review of radiographic studies, pulse oximetry and  re-evaluation of patient's condition.      Signed, Parke Poisson, MD  12/28/2022, 8:17 AM

## 2022-12-28 NOTE — Progress Notes (Addendum)
Pt walked 1,110 ft w/o unusual symtpoms  Educated pt on exercise

## 2022-12-29 LAB — CBC
HCT: 36.3 % — ABNORMAL LOW (ref 39.0–52.0)
Hemoglobin: 11.7 g/dL — ABNORMAL LOW (ref 13.0–17.0)
MCH: 28.8 pg (ref 26.0–34.0)
MCHC: 32.2 g/dL (ref 30.0–36.0)
MCV: 89.4 fL (ref 80.0–100.0)
Platelets: 396 10*3/uL (ref 150–400)
RBC: 4.06 MIL/uL — ABNORMAL LOW (ref 4.22–5.81)
RDW: 12.8 % (ref 11.5–15.5)
WBC: 8.9 10*3/uL (ref 4.0–10.5)
nRBC: 0 % (ref 0.0–0.2)

## 2022-12-29 LAB — BASIC METABOLIC PANEL
Anion gap: 10 (ref 5–15)
BUN: 20 mg/dL (ref 6–20)
CO2: 25 mmol/L (ref 22–32)
Calcium: 8.3 mg/dL — ABNORMAL LOW (ref 8.9–10.3)
Chloride: 102 mmol/L (ref 98–111)
Creatinine, Ser: 1.2 mg/dL (ref 0.61–1.24)
GFR, Estimated: >60 mL/min (ref >60)
Glucose, Bld: 76 mg/dL (ref 70–99)
Potassium: 4.4 mmol/L (ref 3.5–5.1)
Sodium: 137 mmol/L (ref 135–145)

## 2022-12-29 LAB — GLUCOSE, CAPILLARY
Glucose-Capillary: 103 mg/dL — ABNORMAL HIGH (ref 70–99)
Glucose-Capillary: 78 mg/dL (ref 70–99)
Glucose-Capillary: 151 mg/dL — ABNORMAL HIGH (ref 70–99)
Glucose-Capillary: 164 mg/dL — ABNORMAL HIGH (ref 70–99)

## 2022-12-29 NOTE — Progress Notes (Signed)
Rounding Note    Patient Name: Ryan Petersen Date of Encounter: 12/29/2022  [facility] Cardiologist: [place]  Subjective   No acute events overnight  Pacer wire removed yesterday without incident.  Tele with SR  No cp.   Inpatient Medications    Scheduled Meds:  aspirin  81 mg Oral Daily   atorvastatin  80 mg Oral Daily   Chlorhexidine Gluconate Cloth  6 each Topical Daily   clopidogrel  75 mg Oral Daily   colchicine  0.6 mg Oral BID   empagliflozin  10 mg Oral Daily   enoxaparin (LOVENOX) injection  40 mg Subcutaneous Q24H   gabapentin  600 mg Oral Q12H   insulin aspart  0-20 Units Subcutaneous TID WC   insulin aspart  0-5 Units Subcutaneous QHS   insulin aspart  4 Units Subcutaneous TID WC   insulin glargine-yfgn  15 Units Subcutaneous QHS   losartan  12.5 mg Oral QHS   sodium chloride flush  3 mL Intravenous Q12H   Continuous Infusions:  sodium chloride 10 mL/hr at 12/28/22 0600   sodium chloride Stopped (12/27/22 1426)   PRN Meds: sodium chloride, acetaminophen, morphine injection, ondansetron (ZOFRAN) IV, mouth rinse, senna, sodium chloride flush, traMADol   Vital Signs    Vitals:   12/29/22 0400 12/29/22 0800 12/29/22 0813 12/29/22 1128  BP: (!) 126/90 121/83    Pulse: (!) 101     Resp:      Temp:   98.4 F (36.9 C) 98.4 F (36.9 C)  TempSrc:   Oral Oral  SpO2: 98%     Weight:      Height:        Intake/Output Summary (Last 24 hours) at 12/29/2022 1242 Last data filed at 12/28/2022 1840 Gross per 24 hour  Intake --  Output 750 ml  Net -750 ml      12/23/2022   10:01 PM 10/15/2015   10:26 AM  Last 3 Weights  Weight (lbs) 192 lb 205 lb  Weight (kg) 87.091 kg 92.987 kg      Telemetry    SR, no avb - Personally Reviewed  ECG    No new 12/24/22 mobitz I with persistent ST elevations - Personally Reviewed  Physical Exam   GEN: No acute distress.   Neck: No JVD Cardiac: RRR, faint systolic murmur, no rubs, or gallops.   Respiratory: Clear to auscultation bilaterally. GI: Soft, nontender, non-distended  MS: No edema; No deformity. Neuro:  Nonfocal  Psych: Normal affect   Labs    High Sensitivity Troponin:   Recent Labs  Lab 12/23/22 2207 12/24/22 0047  TROPONINIHS 4,852* 4,524*     Chemistry Recent Labs  Lab 12/23/22 2210 12/24/22 0047 12/26/22 0052 12/27/22 0109 12/28/22 0200 12/29/22 0527  NA 132* 133*   < > 133* 132* 137  K 3.8 3.9   < > 3.7 3.9 4.4  CL 96* 95*   < > 99 99 102  CO2 25 23   < > GLUCOSE 342* 358*   < > 122* 147* 76  BUN 25* 23*   < > 34* 27* 20  CREATININE 1.30* 1.41*   < > 1.38* 1.35* 1.20  CALCIUM 8.5* 8.6*   < > 8.2* 8.6* 8.3*  MG  --  2.0  --   --   --   --   PROT 7.8  --   --   --   --   --   ALBUMIN  3.8  --   --   --   --   --   AST 53*  --   --   --   --   --   ALT 28  --   --   --   --   --   ALKPHOS 92  --   --   --   --   --   BILITOT 1.3*  --   --   --   --   --   GFRNONAA >60 58*   < > 59* >60 >60  ANIONGAP 11 15   < > < > = values in this interval not displayed.    Lipids  Recent Labs  Lab 12/23/22 2210  CHOL 189  TRIG 106  HDL 56  LDLCALC NOT CALCULATED  CHOLHDL 3.4    Hematology Recent Labs  Lab 12/25/22 0051 12/26/22 0052 12/29/22 0527  WBC 13.0* 10.7* 8.9  RBC 3.94* 3.62* 4.06*  HGB 11.5* 10.8* 11.7*  HCT 34.6* 32.0* 36.3*  MCV 87.8 88.4 89.4  MCH 29.2 29.8 28.8  MCHC 33.2 33.8 32.2  RDW 12.7 12.7 12.8  PLT 248 271 396   Thyroid No results for input(s): "TSH", "FREET4" in the last 168 hours.  BNPNo results for input(s): "BNP", "PROBNP" in the last 168 hours.  DDimer No results for input(s): "DDIMER" in the last 168 hours.   Radiology    No results found.  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 Minimal left to right collaterals. Mild LV dysfunction with basal to mid inferior akinesis Normal LVEDP  4/18 TTE personal  interpretation: Preserved LV function with inferior RWMA. RV mildly reduced fx and mildly enlarged. MR is mild in setting of RWMA. Tricuspid AV.  Patient Profile     57 y.o. male T2DM (poorly controlled), medical noncompliance, HTN, HL here with late presentation inferior STEMI (no PCI) now with AV block.   Assessment & Plan    Late presentation STEMI: >48 hours from chest pain onset, completed inferior infarct Occluded RCA with minimal collaterals. PCI deferred due to late presentation.  Continue Plavix, aspirin, statin, colchine, morphine for pain.  Persistent ST elevations on tele - no obvious LV aneurysm at site of wall motion abnormalities. Observe. Murmur:  absent today.  Cardiomyopathy:  EF improved on TTE yesterday. RWMA persist in LV, right ventricle is mildly reduced fx and mildly enlarged. AV block: Temp wire out. No av block. Keep in hospital for tele monitoring until Monday, EP to comment on final need for PPM but looks less likely. Hypertension: Well afterload reduced.  BP normal Hyperlipidemia: Continue high-dose atorvastatin Type 2 diabetes: Cont Jardiance, ASA, statin; on SSI insulin. AKI:  stable today, 1.38 >> 1.35 >>1.2  Tx to floor today. Likely dc tomorrow.  For questions or updates, please contact [facility] Please consult [email or web address] for contact info under        Signed, Parke Poisson, MD  12/29/2022, 12:42 PM

## 2022-12-30 ENCOUNTER — Other Ambulatory Visit (HOSPITAL_COMMUNITY): Payer: Self-pay

## 2022-12-30 DIAGNOSIS — I10 Essential (primary) hypertension: Secondary | ICD-10-CM | POA: Insufficient documentation

## 2022-12-30 DIAGNOSIS — I5020 Unspecified systolic (congestive) heart failure: Secondary | ICD-10-CM | POA: Insufficient documentation

## 2022-12-30 DIAGNOSIS — I441 Atrioventricular block, second degree: Secondary | ICD-10-CM | POA: Insufficient documentation

## 2022-12-30 DIAGNOSIS — E118 Type 2 diabetes mellitus with unspecified complications: Secondary | ICD-10-CM | POA: Insufficient documentation

## 2022-12-30 DIAGNOSIS — E785 Hyperlipidemia, unspecified: Secondary | ICD-10-CM | POA: Insufficient documentation

## 2022-12-30 LAB — BASIC METABOLIC PANEL
Anion gap: 10 (ref 5–15)
BUN: 19 mg/dL (ref 6–20)
CO2: 25 mmol/L (ref 22–32)
Calcium: 8.4 mg/dL — ABNORMAL LOW (ref 8.9–10.3)
Chloride: 100 mmol/L (ref 98–111)
Creatinine, Ser: 1.22 mg/dL (ref 0.61–1.24)
GFR, Estimated: >60 mL/min (ref >60)
Glucose, Bld: 120 mg/dL — ABNORMAL HIGH (ref 70–99)
Potassium: 4.1 mmol/L (ref 3.5–5.1)
Sodium: 135 mmol/L (ref 135–145)

## 2022-12-30 LAB — GLUCOSE, CAPILLARY
Glucose-Capillary: 80 mg/dL (ref 70–99)
Glucose-Capillary: 166 mg/dL — ABNORMAL HIGH (ref 70–99)

## 2022-12-30 LAB — CBC
HCT: 34.3 % — ABNORMAL LOW (ref 39.0–52.0)
Hemoglobin: 11 g/dL — ABNORMAL LOW (ref 13.0–17.0)
MCH: 28.8 pg (ref 26.0–34.0)
MCHC: 32.1 g/dL (ref 30.0–36.0)
MCV: 89.8 fL (ref 80.0–100.0)
Platelets: 438 10*3/uL — ABNORMAL HIGH (ref 150–400)
RBC: 3.82 MIL/uL — ABNORMAL LOW (ref 4.22–5.81)
RDW: 12.8 % (ref 11.5–15.5)
WBC: 8.6 10*3/uL (ref 4.0–10.5)
nRBC: 0 % (ref 0.0–0.2)

## 2022-12-30 MED ORDER — INSULIN PEN NEEDLE 32G X 4 MM MISC: 1.0000 | Freq: Every day | 1 refills | Status: AC

## 2022-12-30 MED ORDER — EMPAGLIFLOZIN 10 MG PO TABS: 10.0000 mg | ORAL_TABLET | Freq: Every day | ORAL | 1 refills | Status: AC

## 2022-12-30 MED ORDER — LANTUS SOLOSTAR 100 UNIT/ML ~~LOC~~ SOPN: 15.0000 [IU] | PEN_INJECTOR | Freq: Every day | SUBCUTANEOUS | 0 refills | Status: AC

## 2022-12-30 MED ORDER — ASPIRIN 81 MG PO CHEW: 81.0000 mg | CHEWABLE_TABLET | Freq: Every day | ORAL | 1 refills | Status: AC

## 2022-12-30 MED ORDER — ACCU-CHEK SOFTCLIX LANCETS MISC: 1.0000 | Freq: Three times a day (TID) | 0 refills | Status: AC

## 2022-12-30 MED ORDER — ACCU-CHEK GUIDE VI STRP: 1.0000 | ORAL_STRIP | Freq: Three times a day (TID) | 0 refills | Status: AC

## 2022-12-30 MED ORDER — ATORVASTATIN CALCIUM 80 MG PO TABS: 80.0000 mg | ORAL_TABLET | Freq: Every day | ORAL | 1 refills | Status: AC

## 2022-12-30 MED ORDER — LOSARTAN POTASSIUM 25 MG PO TABS: 12.5000 mg | ORAL_TABLET | Freq: Every day | ORAL | 0 refills | Status: AC

## 2022-12-30 MED ORDER — INSULIN PEN NEEDLE 32G X 4 MM MISC
1.0000 | Freq: Every day | 1 refills | Status: DC
  Filled 2022-12-30: qty 100, 100d supply, fill #0

## 2022-12-30 MED ORDER — LANTUS SOLOSTAR 100 UNIT/ML ~~LOC~~ SOPN
15.0000 [IU] | PEN_INJECTOR | Freq: Every day | SUBCUTANEOUS | 0 refills | Status: DC
  Filled 2022-12-30: qty 3, 20d supply, fill #0

## 2022-12-30 MED ORDER — ACCU-CHEK GUIDE VI STRP
1.0000 | ORAL_STRIP | Freq: Three times a day (TID) | 0 refills | Status: DC
  Filled 2022-12-30: qty 100, 34d supply, fill #0

## 2022-12-30 MED ORDER — COLCHICINE 0.6 MG PO TABS: 0.6000 mg | ORAL_TABLET | Freq: Two times a day (BID) | ORAL | 0 refills | Status: AC

## 2022-12-30 MED ORDER — EMPAGLIFLOZIN 10 MG PO TABS
10.0000 mg | ORAL_TABLET | Freq: Every day | ORAL | 1 refills | Status: DC
  Filled 2022-12-30: qty 30, 30d supply, fill #0

## 2022-12-30 MED ORDER — ACCU-CHEK SOFTCLIX LANCETS MISC
1.0000 | Freq: Three times a day (TID) | 0 refills | Status: DC
  Filled 2022-12-30: qty 100, 30d supply, fill #0

## 2022-12-30 MED ORDER — ATORVASTATIN CALCIUM 80 MG PO TABS
80.0000 mg | ORAL_TABLET | Freq: Every day | ORAL | 1 refills | Status: DC
  Filled 2022-12-30: qty 30, 30d supply, fill #0

## 2022-12-30 MED ORDER — ASPIRIN 81 MG PO CHEW
81.0000 mg | CHEWABLE_TABLET | Freq: Every day | ORAL | 1 refills | Status: DC
  Filled 2022-12-30: qty 90, 90d supply, fill #0

## 2022-12-30 MED ORDER — COLCHICINE 0.6 MG PO TABS
0.6000 mg | ORAL_TABLET | Freq: Two times a day (BID) | ORAL | 0 refills | Status: DC
  Filled 2022-12-30: qty 60, 30d supply, fill #0

## 2022-12-30 MED ORDER — BLOOD GLUCOSE MONITOR SYSTEM W/DEVICE KIT
1.0000 | PACK | Freq: Three times a day (TID) | 0 refills | Status: DC
  Filled 2022-12-30: qty 1, 30d supply, fill #0

## 2022-12-30 MED ORDER — CLOPIDOGREL BISULFATE 75 MG PO TABS
75.0000 mg | ORAL_TABLET | Freq: Every day | ORAL | 1 refills | Status: DC
  Filled 2022-12-30: qty 30, 30d supply, fill #0

## 2022-12-30 MED ORDER — BLOOD GLUCOSE MONITOR SYSTEM W/DEVICE KIT: 1.0000 | PACK | Freq: Three times a day (TID) | 0 refills | Status: AC

## 2022-12-30 MED ORDER — LOSARTAN POTASSIUM 25 MG PO TABS
12.5000 mg | ORAL_TABLET | Freq: Every day | ORAL | 0 refills | Status: DC
  Filled 2022-12-30: qty 15, 30d supply, fill #0

## 2022-12-30 MED ORDER — CLOPIDOGREL BISULFATE 75 MG PO TABS: 75.0000 mg | ORAL_TABLET | Freq: Every day | ORAL | 1 refills | Status: AC

## 2022-12-30 NOTE — Progress Notes (Signed)
Rounding Note    Patient Name: Ryan Petersen Date of Encounter: 12/30/2022  [facility] Cardiologist: [place]  Subjective   No chest pain or dyspnea  Inpatient Medications    Scheduled Meds:  aspirin  81 mg Oral Daily   atorvastatin  80 mg Oral Daily   Chlorhexidine Gluconate Cloth  6 each Topical Daily   clopidogrel  75 mg Oral Daily   colchicine  0.6 mg Oral BID   empagliflozin  10 mg Oral Daily   enoxaparin (LOVENOX) injection  40 mg Subcutaneous Q24H   gabapentin  600 mg Oral Q12H   insulin aspart  0-20 Units Subcutaneous TID WC   insulin aspart  0-5 Units Subcutaneous QHS   insulin aspart  4 Units Subcutaneous TID WC   insulin glargine-yfgn  15 Units Subcutaneous QHS   losartan  12.5 mg Oral QHS   sodium chloride flush  3 mL Intravenous Q12H   Continuous Infusions:  sodium chloride 10 mL/hr at 12/28/22 0600   sodium chloride Stopped (12/27/22 1426)   PRN Meds: sodium chloride, acetaminophen, morphine injection, ondansetron (ZOFRAN) IV, mouth rinse, senna, sodium chloride flush, traMADol   Vital Signs    Vitals:   12/29/22 1900 12/29/22 2000 12/29/22 2300 12/30/22 0700  BP:  126/88    Pulse:  100    Resp:  20    Temp: 98.9 F (37.2 C)  99.1 F (37.3 C) 98.3 F (36.8 C)  TempSrc: Oral  Oral Oral  SpO2:  96%    Weight:      Height:       No intake or output data in the 24 hours ending 12/30/22 0749    12/23/2022   10:01 PM 10/15/2015   10:26 AM  Last 3 Weights  Weight (lbs) 192 lb 205 lb  Weight (kg) 87.091 kg 92.987 kg      Telemetry    Sinus - Personally Reviewed  ECG    No AM EKG - Personally Reviewed  Physical Exam   GEN: No acute distress.   Neck: No JVD Cardiac: RRR, no murmurs, rubs, or gallops.  Respiratory: Clear to auscultation bilaterally. GI: Soft, nontender, non-distended  MS: No edema; No deformity. Neuro:  Nonfocal  Psych: Normal affect   Labs    High Sensitivity Troponin:   Recent Labs  Lab  12/23/22 2207 12/24/22 0047  TROPONINIHS 4,852* 4,524*     Chemistry Recent Labs  Lab 12/23/22 2210 12/24/22 0047 12/26/22 0052 12/28/22 0200 12/29/22 0527 12/30/22 0034  NA 132* 133*   < > 132* 137 135  K 3.8 3.9   < > 3.9 4.4 4.1  CL 96* 95*   < > 99 102 100  CO2 25 23   < > GLUCOSE 342* 358*   < > 147* 76 120*  BUN 25* 23*   < > 27* 20 19  CREATININE 1.30* 1.41*   < > 1.35* 1.20 1.22  CALCIUM 8.5* 8.6*   < > 8.6* 8.3* 8.4*  MG  --  2.0  --   --   --   --   PROT 7.8  --   --   --   --   --   ALBUMIN 3.8  --   --   --   --   --   AST 53*  --   --   --   --   --   ALT 28  --   --   --   --   --  ALKPHOS 92  --   --   --   --   --   BILITOT 1.3*  --   --   --   --   --   GFRNONAA >60 58*   < > >60 >60 >60  ANIONGAP 11 15   < > < > = values in this interval not displayed.    Lipids  Recent Labs  Lab 12/23/22 2210  CHOL 189  TRIG 106  HDL 56  LDLCALC NOT CALCULATED  CHOLHDL 3.4    Hematology Recent Labs  Lab 12/26/22 0052 12/29/22 0527 12/30/22 0034  WBC 10.7* 8.9 8.6  RBC 3.62* 4.06* 3.82*  HGB 10.8* 11.7* 11.0*  HCT 32.0* 36.3* 34.3*  MCV 88.4 89.4 89.8  MCH 29.8 28.8 28.8  MCHC 33.8 32.2 32.1  RDW 12.7 12.8 12.8  PLT 271 396 438*   Thyroid No results for input(s): "TSH", "FREET4" in the last 168 hours.  BNPNo results for input(s): "BNP", "PROBNP" in the last 168 hours.  DDimer No results for input(s): "DDIMER" in the last 168 hours.   Radiology    No results found.  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 Minimal left to right collaterals. Mild LV dysfunction with basal to mid inferior akinesis Normal LVEDP   TTE 4/16 1. Left ventricular ejection fraction, by estimation, is 40 to 45%. The  left ventricle has mildly decreased function. The left ventricle has no  regional wall motion abnormalities. There is mild concentric left  ventricular hypertrophy. Left  ventricular  diastolic parameters are indeterminate.   2. Right ventricular systolic function mild to moderately reduced. The  right ventricular size is normal. Tricuspid regurgitation signal is  inadequate for assessing PA pressure.   3. The mitral valve is normal in structure. No evidence of mitral valve  regurgitation. No evidence of mitral stenosis.   4. The aortic valve is tricuspid. There is mild thickening of the aortic  valve. Aortic valve regurgitation is not visualized. No aortic stenosis is  present.   5. The inferior vena cava is dilated in size with <50% respiratory  variability, suggesting right atrial pressure of 15 mmHg.   Patient Profile     57 y.o. male with DM, HTN, HLD admitted 12/23/22 with late presenting inferior STEMI. No PCI was performed due to late presentation. He did have AV block and had a temporary wire placed. This has been removed.   Assessment & Plan    CAD/Late presentation inferior STEMI: PCI not performed due to late presenting MI. Will continue ASA, Plavix, statin, colchicine. Will not start a beta blocker given recent high grade AV block.  Ischemic cardiomyopathy: LVEF=55-60% by repeat echo on 12/26/22. Continue ARB. No beta blocker due to AV block.  AV block: EP has been following. Pt now with sinus rhythm and no indications for permanent pacemaker placement.  HTN: BP is well controlled.  HLD: Continue high dose statin.  AKI: Renal function is back to normal.   Will discharge home today  For questions or updates, please contact [facility] Please consult [email or web address] for contact info under        Signed, Verne Carrow, MD  12/30/2022, 7:49 AM

## 2022-12-30 NOTE — Discharge Summary (Addendum)
Discharge Summary    Patient ID: Ryan Petersen MRN: [id number]; DOB: 01-06-1966  Admit date: 12/23/2022 Discharge date: 12/30/2022  PCP:  Pearson Grippe, MD   [facility] Providers Cardiologist:  Peter [place], MD     Discharge Diagnoses    Principal Problem:   ST elevation myocardial infarction (STEMI) Active Problems:   Hypertension   Hyperlipidemia   AV block, Mobitz II   Type 2 diabetes mellitus with complication, without long-term current use of insulin   HFrEF (heart failure with reduced ejection fraction)   Diagnostic Studies/Procedures    Cath: 12/23/2022    1st Mrg lesion is 70% stenosed.   Prox RCA to Dist RCA lesion is 100% stenosed.   There is mild left ventricular systolic dysfunction.   LV end diastolic pressure is normal.   The left ventricular ejection fraction is 50-55% by visual estimate.   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 Minimal left to right collaterals. Mild LV dysfunction with basal to mid inferior akinesis Normal LVEDP   Plan; patient presents with more than 48 hours of continuous chest pain. Now with minimal chest pressure. Findings c/w completed inferior infarct. I don't think there is an indication for reperfusion at this time. Will manage medically.   Diagnostic Dominance: Right  Echo: 12/24/2022  IMPRESSIONS     1. Left ventricular ejection fraction, by estimation, is 40 to 45%. The  left ventricle has mildly decreased function. The left ventricle has no  regional wall motion abnormalities. There is mild concentric left  ventricular hypertrophy. Left ventricular  diastolic parameters are indeterminate.   2. Right ventricular systolic function mild to moderately reduced. The  right ventricular size is normal. Tricuspid regurgitation signal is  inadequate for assessing PA pressure.   3. The mitral valve is normal in structure. No evidence of mitral valve  regurgitation. No evidence of mitral stenosis.   4. The  aortic valve is tricuspid. There is mild thickening of the aortic  valve. Aortic valve regurgitation is not visualized. No aortic stenosis is  present.   5. The inferior vena cava is dilated in size with <50% respiratory  variability, suggesting right atrial pressure of 15 mmHg.   Comparison(s): No prior Echocardiogram.   FINDINGS   Left Ventricle: Left ventricular ejection fraction, by estimation, is 40  to 45%. The left ventricle has mildly decreased function. The left  ventricle has no regional wall motion abnormalities. The left ventricular  internal cavity size was normal in  size. There is mild concentric left ventricular hypertrophy. Left  ventricular diastolic function could not be evaluated due to paced rhythm.  Left ventricular diastolic parameters are indeterminate.     LV Wall Scoring:  The inferior wall and posterior wall are hypokinetic.   Right Ventricle: The right ventricular size is normal. No increase in  right ventricular wall thickness. Right ventricular systolic function mild  to moderately reduced. Tricuspid regurgitation signal is inadequate for  assessing PA pressure.   Left Atrium: Left atrial size was normal in size.   Right Atrium: Right atrial size was normal in size.   Pericardium: There is no evidence of pericardial effusion.   Mitral Valve: The mitral valve is normal in structure. No evidence of  mitral valve regurgitation. No evidence of mitral valve stenosis.   Tricuspid Valve: The tricuspid valve is normal in structure. Tricuspid  valve regurgitation is mild . No evidence of tricuspid stenosis.   Aortic Valve: The aortic valve is tricuspid. There is  mild thickening of  the aortic valve. Aortic valve regurgitation is not visualized. No aortic  stenosis is present.   Pulmonic Valve: The pulmonic valve was normal in structure. Pulmonic valve  regurgitation is mild to moderate. No evidence of pulmonic stenosis.   Aorta: The aortic root and  ascending aorta are structurally normal, with  no evidence of dilitation.   Venous: The inferior vena cava is dilated in size with less than 50%  respiratory variability, suggesting right atrial pressure of 15 mmHg.   IAS/Shunts: No atrial level shunt detected by color flow Doppler.   Additional Comments: A device lead is visualized in the right ventricle  and right atrium.   Echo: 12/26/2022  IMPRESSIONS     1. Left ventricular ejection fraction, by estimation, is 55 to 60%. Left  ventricular ejection fraction by 2D MOD biplane is 56.5 %. The left  ventricle has normal function. The left ventricle demonstrates regional  wall motion abnormalities (see scoring  diagram/findings for description). There is mild left ventricular  hypertrophy. There is moderate hypokinesis of the left ventricular,  mid-apical inferoseptal wall and inferior wall.   2. The mitral valve is abnormal. Trivial mitral valve regurgitation.   3. The aortic valve is tricuspid. Aortic valve regurgitation is not  visualized. No aortic stenosis is present.   4. Aortic dilatation noted. There is mild dilatation of the aortic root,  measuring 41 mm. There is mild dilatation of the ascending aorta,  measuring 44 mm.   5. There is normal pulmonary artery systolic pressure. The estimated  right ventricular systolic pressure is 22.4 mmHg.   6. The inferior vena cava is normal in size with <50% respiratory  variability, suggesting right atrial pressure of 8 mmHg.   Comparison(s): Changes from prior study are noted. 12/24/2022: LVEF 40-45%.   Conclusion(s)/Recommendation(s): No evidence for VSD or mechanical  complication of MI - LVEF has improved.   FINDINGS   Left Ventricle: Left ventricular ejection fraction, by estimation, is 55  to 60%. Left ventricular ejection fraction by 2D MOD biplane is 56.5 %.  The left ventricle has normal function. The left ventricle demonstrates  regional wall motion abnormalities.   Moderate hypokinesis of the left ventricular, mid-apical inferoseptal wall  and inferior wall. The left ventricular internal cavity size was normal in  size. There is mild left ventricular hypertrophy.   Right Ventricle: There is normal pulmonary artery systolic pressure. The  tricuspid regurgitant velocity is 1.90 m/s, and with an assumed right  atrial pressure of 8 mmHg, the estimated right ventricular systolic  pressure is 22.4 mmHg.   Pericardium: There is no evidence of pericardial effusion.   Mitral Valve: The mitral valve is abnormal. There is mild thickening of  the anterior and posterior mitral valve leaflet(s). Trivial mitral valve  regurgitation. MV peak gradient, 5.0 mmHg. The mean mitral valve gradient  is 1.0 mmHg.   Tricuspid Valve: The tricuspid valve is grossly normal. Tricuspid valve  regurgitation is trivial.   Aortic Valve: The aortic valve is tricuspid. Aortic valve regurgitation is  not visualized. No aortic stenosis is present. Aortic valve mean gradient  measures 3.0 mmHg. Aortic valve peak gradient measures 4.8 mmHg. Aortic  valve area, by VTI measures 4.23  cm.   Pulmonic Valve: The pulmonic valve was grossly normal. Pulmonic valve  regurgitation is trivial.   Aorta: Aortic dilatation noted. There is mild dilatation of the aortic  root, measuring 41 mm. There is mild dilatation of the ascending aorta,  measuring 44 mm.   Venous: The inferior vena cava is normal in size with less than 50%  respiratory variability, suggesting right atrial pressure of 8 mmHg.     _____________   History of Present Illness     Cabell Lazenby is a 57 y.o. male with history of hypertension hyperlipidemia diabetes who presented with chest pain for over 48 hours.  He reported he was playing basketball around noon the Saturday prior to admission.  He developed sudden onset of chest pain that remained constant until arrival.  There is family ultimately convinced him to come to  the ED for further evaluation.  He presented to [facility] was found to have inferior ST elevation on EKG with ongoing chest pain.  Code STEMI was activated.  Hospital Course     Consultants: EP   Late presenting inferior STEMI Pericarditis -- Underwent cardiac catheterization noted above with single-vessel occlusive CAD involving the large RCA with left-to-right collaterals.  Given late presentation he was recommended for medical management. Seen by cardiac rehab and progressed well.  -- Continue aspirin, Plavix, statin, colchicine.  No plans for beta-blocker given recent high degree AV block  HFrEF ICM -- Echocardiogram 4/16 showed LVEF of 40 to 45%, moderately reduced RV with normal size, no significant valvular disease.  Repeat echocardiogram 4/18 showed LVEF 55 to 60%, moderate hypokinesis of the LV, apical inferior septal wall and inferior wall. -- continue losartan   AV block -- Status post temporary pacemaker, seen by EP.  He did regain conduction and converted back into sinus rhythm.  Temp pacer removed 4/20 and maintained sinus rhythm.  No indication for pacemaker at this time.  Hypertension -- well controlled -- continue losartan  Hyperlipidemia -- HDL 56, LDL not calculated, LP (a) 195 -- continue atorvastatin -- FLP/LFTs in 8 weeks   DM -- Hgb A1c 13.2 -- started on jardiance, lantus 15 units/daily (glucose monitor, strips and lancets) -- follow up with PCP, endocrinology   Did the patient have an acute coronary syndrome (MI, NSTEMI, STEMI, etc) this admission?:  Yes                               AHA/ACC Clinical Performance & Quality Measures: Aspirin prescribed? - Yes ADP Receptor Inhibitor (Plavix/Clopidogrel, Brilinta/Ticagrelor or Effient/Prasugrel) prescribed (includes medically managed patients)? - Yes Beta Blocker prescribed? - No - AV block with temp wire High Intensity Statin (Lipitor 40-80mg  or Crestor 20-40mg ) prescribed? - Yes EF assessed  during THIS hospitalization? - Yes For EF <40%, was ACEI/ARB prescribed? - Not Applicable (EF >/= 40%) For EF <40%, Aldosterone Antagonist (Spironolactone or Eplerenone) prescribed? - Not Applicable (EF >/= 40%) Cardiac Rehab Phase II ordered (including medically managed patients)? - Yes   The patient will be scheduled for a TOC follow up appointment in 10-14 days.  A message has been sent to the [facility] and Scheduling Pool at the office where the patient should be seen for follow up.  _____________  Discharge Vitals Blood pressure (!) 129/90, pulse 100, temperature 98.3 F (36.8 C), temperature source Oral, resp. rate 20, height 5\' 10"  (1.778 m), weight 87.1 kg, SpO2 96 %.  Filed Weights   12/23/22 2201  Weight: 87.1 kg    Labs & Radiologic Studies    CBC Recent Labs    12/29/22 0527 12/30/22 0034  WBC 8.9 8.6  HGB 11.7* 11.0*  HCT 36.3* 34.3*  MCV 89.4  89.8  PLT 396 438*   Basic Metabolic Panel Recent Labs    09/81/19 0527 12/30/22 0034  NA 137 135  K 4.4 4.1  CL 102 100  CO2 25 25  GLUCOSE 76 120*  BUN 20 19  CREATININE 1.20 1.22  CALCIUM 8.3* 8.4*   Liver Function Tests No results for input(s): "AST", "ALT", "ALKPHOS", "BILITOT", "PROT", "ALBUMIN" in the last 72 hours. No results for input(s): "LIPASE", "AMYLASE" in the last 72 hours. High Sensitivity Troponin:   Recent Labs  Lab 12/23/22 2207 12/24/22 0047  TROPONINIHS 4,852* 4,524*    BNP Invalid input(s): "POCBNP" D-Dimer No results for input(s): "DDIMER" in the last 72 hours. Hemoglobin A1C No results for input(s): "HGBA1C" in the last 72 hours. Fasting Lipid Panel No results for input(s): "CHOL", "HDL", "LDLCALC", "TRIG", "CHOLHDL", "LDLDIRECT" in the last 72 hours. Thyroid Function Tests No results for input(s): "TSH", "T4TOTAL", "T3FREE", "THYROIDAB" in the last 72 hours.  Invalid input(s): "FREET3" _____________  ECHOCARDIOGRAM LIMITED  Result Date: 12/26/2022    ECHOCARDIOGRAM LIMITED  REPORT   Patient Name:   NABIL BUBOLZ Date of Exam: 12/26/2022 Medical Rec #:  [id number]     Height:       70.0 in Accession #:    [id number]    Weight:       192.0 lb Date of Birth:  12-Jul-1966     BSA:          2.051 m Patient Age:    57 years      BP:           111/74 mmHg Patient Gender: M             HR:           62 bpm. Exam Location:  Inpatient Procedure: 2D Echo, Cardiac Doppler and Color Doppler Indications:    murmur/Acute MI  History:        Patient has prior history of Echocardiogram examinations, most                 recent 12/24/2022. Acute MI; Risk Factors:Hypertension and                 Dyslipidemia.  Sonographer:    Wallie Char Referring Phys: [id number] RENEE LYNN URSUY IMPRESSIONS  1. Left ventricular ejection fraction, by estimation, is 55 to 60%. Left ventricular ejection fraction by 2D MOD biplane is 56.5 %. The left ventricle has normal function. The left ventricle demonstrates regional wall motion abnormalities (see scoring diagram/findings for description). There is mild left ventricular hypertrophy. There is moderate hypokinesis of the left ventricular, mid-apical inferoseptal wall and inferior wall.  2. The mitral valve is abnormal. Trivial mitral valve regurgitation.  3. The aortic valve is tricuspid. Aortic valve regurgitation is not visualized. No aortic stenosis is present.  4. Aortic dilatation noted. There is mild dilatation of the aortic root, measuring 41 mm. There is mild dilatation of the ascending aorta, measuring 44 mm.  5. There is normal pulmonary artery systolic pressure. The estimated right ventricular systolic pressure is 22.4 mmHg.  6. The inferior vena cava is normal in size with <50% respiratory variability, suggesting right atrial pressure of 8 mmHg. Comparison(s): Changes from prior study are noted. 12/24/2022: LVEF 40-45%. Conclusion(s)/Recommendation(s): No evidence for VSD or mechanical complication of MI - LVEF has improved. FINDINGS  Left Ventricle: Left  ventricular ejection fraction, by estimation, is 55 to 60%. Left ventricular ejection fraction by 2D MOD biplane is 56.5 %.  The left ventricle has normal function. The left ventricle demonstrates regional wall motion abnormalities. Moderate hypokinesis of the left ventricular, mid-apical inferoseptal wall and inferior wall. The left ventricular internal cavity size was normal in size. There is mild left ventricular hypertrophy. Right Ventricle: There is normal pulmonary artery systolic pressure. The tricuspid regurgitant velocity is 1.90 m/s, and with an assumed right atrial pressure of 8 mmHg, the estimated right ventricular systolic pressure is 22.4 mmHg. Pericardium: There is no evidence of pericardial effusion. Mitral Valve: The mitral valve is abnormal. There is mild thickening of the anterior and posterior mitral valve leaflet(s). Trivial mitral valve regurgitation. MV peak gradient, 5.0 mmHg. The mean mitral valve gradient is 1.0 mmHg. Tricuspid Valve: The tricuspid valve is grossly normal. Tricuspid valve regurgitation is trivial. Aortic Valve: The aortic valve is tricuspid. Aortic valve regurgitation is not visualized. No aortic stenosis is present. Aortic valve mean gradient measures 3.0 mmHg. Aortic valve peak gradient measures 4.8 mmHg. Aortic valve area, by VTI measures 4.23 cm. Pulmonic Valve: The pulmonic valve was grossly normal. Pulmonic valve regurgitation is trivial. Aorta: Aortic dilatation noted. There is mild dilatation of the aortic root, measuring 41 mm. There is mild dilatation of the ascending aorta, measuring 44 mm. Venous: The inferior vena cava is normal in size with less than 50% respiratory variability, suggesting right atrial pressure of 8 mmHg. LEFT VENTRICLE PLAX 2D                        Biplane EF (MOD) LVIDd:         5.30 cm         LV Biplane EF:   Left LVIDs:         3.50 cm                          ventricular LV PW:         1.20 cm                          ejection LV IVS:         1.20 cm                          fraction by LVOT diam:     2.40 cm                          2D MOD LV SV:         90                               biplane is LV SV Index:   44                               56.5 %. LVOT Area:     4.52 cm  LV Volumes (MOD) LV vol d, MOD    96.4 ml A2C: LV vol d, MOD    111.0 ml A4C: LV vol s, MOD    42.2 ml A2C: LV vol s, MOD    48.2 ml A4C: LV SV MOD A2C:   54.2 ml LV SV MOD A4C:   111.0 ml LV SV MOD BP:    59.5 ml RIGHT VENTRICLE  IVC TAPSE (M-mode): 1.8 cm  IVC diam: 2.00 cm LEFT ATRIUM             Index        RIGHT ATRIUM           Index LA diam:        3.60 cm 1.75 cm/m   RA Area:     15.70 cm LA Vol (A2C):   36.4 ml 17.74 ml/m  RA Volume:   41.30 ml  20.13 ml/m LA Vol (A4C):   33.6 ml 16.38 ml/m LA Biplane Vol: 35.0 ml 17.06 ml/m  AORTIC VALVE AV Area (Vmax):    4.15 cm AV Area (Vmean):   4.41 cm AV Area (VTI):     4.23 cm AV Vmax:           110.00 cm/s AV Vmean:          78.600 cm/s AV VTI:            0.213 m AV Peak Grad:      4.8 mmHg AV Mean Grad:      3.0 mmHg LVOT Vmax:         101.00 cm/s LVOT Vmean:        76.600 cm/s LVOT VTI:          0.199 m LVOT/AV VTI ratio: 0.93  AORTA Ao Root diam: 4.10 cm Ao Asc diam:  4.40 cm MITRAL VALVE               TRICUSPID VALVE MV Area (PHT): 4.36 cm    TR Peak grad:   14.4 mmHg MV Area VTI:   2.58 cm    TR Vmax:        190.00 cm/s MV Peak grad:  5.0 mmHg MV Mean grad:  1.0 mmHg    SHUNTS MV Vmax:       1.12 m/s    Systemic VTI:  0.20 m MV Vmean:      44.4 cm/s   Systemic Diam: 2.40 cm MV Decel Time: 174 msec MV E velocity: 99.20 cm/s MV A velocity: 67.90 cm/s MV E/A ratio:  1.46 Zoila Shutter MD Electronically signed by Zoila Shutter MD Signature Date/Time: 12/26/2022/11:37:40 AM    Final    ECHOCARDIOGRAM COMPLETE  Result Date: 12/24/2022    ECHOCARDIOGRAM REPORT   Patient Name:   GASPARE NETZEL Date of Exam: 12/24/2022 Medical Rec #:  [id number]     Height:       70.0 in Accession #:    [id number]    Weight:        192.0 lb Date of Birth:  12/06/1965     BSA:          2.051 m Patient Age:    56 years      BP:           92/78 mmHg Patient Gender: M             HR:           56 bpm. Exam Location:  Inpatient Procedure: 2D Echo, Color Doppler and Cardiac Doppler Indications:    acute myocardial infarction  History:        Patient has no prior history of Echocardiogram examinations.                 Temporary pacemaker; Signs/Symptoms:Chest Pain.  Sonographer:    Delcie Roch RDCS Referring Phys: [phone] PETER M [place] IMPRESSIONS  1. Left ventricular ejection fraction, by  estimation, is 40 to 45%. The left ventricle has mildly decreased function. The left ventricle has no regional wall motion abnormalities. There is mild concentric left ventricular hypertrophy. Left ventricular diastolic parameters are indeterminate.  2. Right ventricular systolic function mild to moderately reduced. The right ventricular size is normal. Tricuspid regurgitation signal is inadequate for assessing PA pressure.  3. The mitral valve is normal in structure. No evidence of mitral valve regurgitation. No evidence of mitral stenosis.  4. The aortic valve is tricuspid. There is mild thickening of the aortic valve. Aortic valve regurgitation is not visualized. No aortic stenosis is present.  5. The inferior vena cava is dilated in size with <50% respiratory variability, suggesting right atrial pressure of 15 mmHg. Comparison(s): No prior Echocardiogram. FINDINGS  Left Ventricle: Left ventricular ejection fraction, by estimation, is 40 to 45%. The left ventricle has mildly decreased function. The left ventricle has no regional wall motion abnormalities. The left ventricular internal cavity size was normal in size. There is mild concentric left ventricular hypertrophy. Left ventricular diastolic function could not be evaluated due to paced rhythm. Left ventricular diastolic parameters are indeterminate.  LV Wall Scoring: The inferior wall and posterior  wall are hypokinetic. Right Ventricle: The right ventricular size is normal. No increase in right ventricular wall thickness. Right ventricular systolic function mild to moderately reduced. Tricuspid regurgitation signal is inadequate for assessing PA pressure. Left Atrium: Left atrial size was normal in size. Right Atrium: Right atrial size was normal in size. Pericardium: There is no evidence of pericardial effusion. Mitral Valve: The mitral valve is normal in structure. No evidence of mitral valve regurgitation. No evidence of mitral valve stenosis. Tricuspid Valve: The tricuspid valve is normal in structure. Tricuspid valve regurgitation is mild . No evidence of tricuspid stenosis. Aortic Valve: The aortic valve is tricuspid. There is mild thickening of the aortic valve. Aortic valve regurgitation is not visualized. No aortic stenosis is present. Pulmonic Valve: The pulmonic valve was normal in structure. Pulmonic valve regurgitation is mild to moderate. No evidence of pulmonic stenosis. Aorta: The aortic root and ascending aorta are structurally normal, with no evidence of dilitation. Venous: The inferior vena cava is dilated in size with less than 50% respiratory variability, suggesting right atrial pressure of 15 mmHg. IAS/Shunts: No atrial level shunt detected by color flow Doppler. Additional Comments: A device lead is visualized in the right ventricle and right atrium.  LEFT VENTRICLE PLAX 2D LVIDd:         4.80 cm LVIDs:         3.70 cm LV PW:         1.20 cm LV IVS:        1.20 cm LVOT diam:     2.40 cm LV SV:         58 LV SV Index:   28 LVOT Area:     4.52 cm  RIGHT VENTRICLE            IVC RV Basal diam:  2.90 cm    IVC diam: 2.60 cm RV S prime:     6.74 cm/s TAPSE (M-mode): 1.3 cm LEFT ATRIUM             Index        RIGHT ATRIUM           Index LA diam:        3.30 cm 1.61 cm/m   RA Area:     14.90 cm LA Vol (A2C):  64.6 ml 31.49 ml/m  RA Volume:   35.80 ml  17.45 ml/m LA Vol (A4C):   44.2 ml  21.55 ml/m LA Biplane Vol: 54.6 ml 26.62 ml/m  AORTIC VALVE LVOT Vmax:   74.40 cm/s LVOT Vmean:  44.500 cm/s LVOT VTI:    0.128 m  AORTA Ao Root diam: 3.70 cm Ao Asc diam:  3.80 cm TRICUSPID VALVE TR Peak grad:   14.1 mmHg TR Vmax:        188.00 cm/s  SHUNTS Systemic VTI:  0.13 m Systemic Diam: 2.40 cm Riley Lam MD Electronically signed by Riley Lam MD Signature Date/Time: 12/24/2022/2:04:50 PM    Final    CARDIAC CATHETERIZATION  Result Date: 12/24/2022 Successful transvenous pacemaker placement via right internal jugular access under fluoroscopic guidance. Pacemaker set at a backup rate of 40 bpm Pacing threshold 0.6 MA No immediate complication   CARDIAC CATHETERIZATION  Result Date: 12/23/2022   1st Mrg lesion is 70% stenosed.   Prox RCA to Dist RCA lesion is 100% stenosed.   There is mild left ventricular systolic dysfunction.   LV end diastolic pressure is normal.   The left ventricular ejection fraction is 50-55% by visual estimate. 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 Minimal left to right collaterals. Mild LV dysfunction with basal to mid inferior akinesis Normal LVEDP Plan; patient presents with more than 48 hours of continuous chest pain. Now with minimal chest pressure. Findings c/w completed inferior infarct. I don't think there is an indication for reperfusion at this time. Will manage medically.   DG Chest Port 1 View  Result Date: 12/23/2022 CLINICAL DATA:  Stemi CP since Friday and worse since Saturday especially since playing basketball - got SOB and weak and cannot take a full breath. EXAM: PORTABLE CHEST 1 VIEW COMPARISON:  None Available. FINDINGS: Cardiac paddles overlie the chest. Enlarged cardiac silhouette. Otherwise the heart and mediastinal contours are within normal limits. No focal consolidation. No pulmonary edema. No pleural effusion. No pneumothorax. No acute osseous abnormality. IMPRESSION: 1. No active disease. 2. Enlarged cardiac  silhouette with some component likely due to portable AP technique. Electronically Signed   By: Tish Frederickson M.D.   On: 12/23/2022 22:37   Disposition   Pt is being discharged home today in good condition.  Follow-up Plans & Appointments     Follow-up Information     Joylene Grapes, NP Follow up on 01/10/2023.   Specialties: Cardiology, Family Medicine Why: at 10am for your follow up appt with Dr. Illa Level' NP Cydney Ok information: [address] [place] [address] [phone]         [facility] Follow up.   Why: please follow up regarding management of our diabetes               Discharge Instructions     Amb Referral to Cardiac Rehabilitation   Complete by: As directed    Diagnosis: STEMI   After initial evaluation and assessments completed: Virtual Based Care may be provided alone or in conjunction with Phase 2 Cardiac Rehab based on patient barriers.: Yes   Intensive Cardiac Rehabilitation (ICR) [facility] location only OR Traditional Cardiac Rehabilitation (TCR) *If criteria for ICR are not met will enroll in TCR [facility] only): Yes   Ambulatory referral to Nutrition and Diabetic Education   Complete by: As directed    Diet - low sodium heart healthy   Complete by: As directed    Discharge instructions  Complete by: As directed    Radial Site Care Refer to this sheet in the next few weeks. These instructions provide you with information on caring for yourself after your procedure. Your caregiver may also give you more specific instructions. Your treatment has been planned according to current medical practices, but problems sometimes occur. Call your caregiver if you have any problems or questions after your procedure. HOME CARE INSTRUCTIONS You may shower the day after the procedure. Remove the bandage (dressing) and gently wash the site with plain soap and water. Gently pat the site dry.  Do not apply powder or lotion to the site.  Do not  submerge the affected site in water for 3 to 5 days.  Inspect the site at least twice daily.  Do not flex or bend the affected arm for 24 hours.  No lifting over 5 pounds (2.3 kg) for 5 days after your procedure.  Do not drive home if you are discharged the same day of the procedure. Have someone else drive you.  You may drive 24 hours after the procedure unless otherwise instructed by your caregiver.  What to expect: Any bruising will usually fade within 1 to 2 weeks.  Blood that collects in the tissue (hematoma) may be painful to the touch. It should usually decrease in size and tenderness within 1 to 2 weeks.  SEEK IMMEDIATE MEDICAL CARE IF: You have unusual pain at the radial site.  You have redness, warmth, swelling, or pain at the radial site.  You have drainage (other than a small amount of blood on the dressing).  You have chills.  You have a fever or persistent symptoms for more than 72 hours.  You have a fever and your symptoms suddenly get worse.  Your arm becomes pale, cool, tingly, or numb.  You have heavy bleeding from the site. Hold pressure on the site.   PLEASE DO NOT MISS ANY DOSES OF YOUR PLAVIX!!!!! Also keep a log of you blood pressures and bring back to your follow up appt. Please call the office with any questions.   Patients taking blood thinners should generally stay away from medicines like ibuprofen, Advil, Motrin, naproxen, and Aleve due to risk of stomach bleeding. You may take Tylenol as directed or talk to your primary doctor about alternatives.  Some studies suggest Prilosec/Omeprazole interacts with Plavix. We changed your Prilosec/Omeprazole to the equivalent dose of Protonix for less chance of interaction.  PLEASE ENSURE THAT YOU DO NOT RUN OUT OF YOUR PLAVIX. This medication is very important to remain on for at least one year. IF you have issues obtaining this medication due to cost please CALL the office 3-5 business days prior to running out in order  to prevent missing doses of this medication.   Increase activity slowly   Complete by: As directed    No wound care   Complete by: As directed         Discharge Medications   Allergies as of 12/30/2022       Reactions   Doxycycline Rash        Medication List     STOP taking these medications    carvedilol 6.25 MG tablet Commonly known as: COREG       TAKE these medications    Accu-Chek Guide test strip Generic drug: glucose blood 1 each in the morning, at noon, and at bedtime.   aspirin 81 MG chewable tablet Chew 1 tablet (81 mg total) by mouth daily. Start  taking on: December 31, 2022   atorvastatin 80 MG tablet Commonly known as: LIPITOR Take 1 tablet (80 mg total) by mouth daily. Start taking on: December 31, 2022   Blood Glucose Monitor System w/Device Kit Test blood glucose in the morning, at noon, and at bedtime.   clopidogrel 75 MG tablet Commonly known as: PLAVIX Take 1 tablet (75 mg total) by mouth daily. Start taking on: December 31, 2022   colchicine 0.6 MG tablet Take 1 tablet (0.6 mg total) by mouth 2 (two) times daily.   empagliflozin 10 MG Tabs tablet Commonly known as: JARDIANCE Take 1 tablet (10 mg total) by mouth daily. Start taking on: December 31, 2022   gabapentin 300 MG capsule Commonly known as: NEURONTIN Take 900 mg by mouth 3 (three) times daily.   Lancets Misc. Misc 1 each by Does not apply route in the morning, at noon, and at bedtime. May substitute to any manufacturer covered by patient's insurance.   Lantus SoloStar 100 UNIT/ML Solostar Pen Generic drug: insulin glargine Inject 15 Units into the skin daily.   losartan 25 MG tablet Commonly known as: COZAAR Take 0.5 tablets (12.5 mg total) by mouth at bedtime.   Pen Needles 3/16" 31G X 5 MM Misc 1 Needle by Does not apply route daily.   traMADol 50 MG tablet Commonly known as: ULTRAM Take 100 mg by mouth every 6 (six) hours as needed (for back pain).          Outstanding Labs/Studies   FLP/LFTs in 8 weeks  Duration of Discharge Encounter   Greater than 30 minutes including physician time.  Signed, Laverda Page, NP 12/30/2022, 11:19 AM  I have personally seen and examined this patient. I agree with the assessment and plan as outlined above.  See my full note from this am. D/C home today.   Verne Carrow, MD, [facility] [place] 12/30/2022 12:32 PM

## 2022-12-30 NOTE — Progress Notes (Cosign Needed Addendum)
  Patient Name: Ryan Petersen Date of Encounter: 12/30/2022  Primary Cardiologist: None Electrophysiologist: Dr. Lalla Brothers  Interval Summary   The patient is doing well today.  At this time, the patient denies chest pain, shortness of breath, or any new concerns. Anxious to go home.   Inpatient Medications    Scheduled Meds:  aspirin  81 mg Oral Daily   atorvastatin  80 mg Oral Daily   Chlorhexidine Gluconate Cloth  6 each Topical Daily   clopidogrel  75 mg Oral Daily   colchicine  0.6 mg Oral BID   empagliflozin  10 mg Oral Daily   enoxaparin (LOVENOX) injection  40 mg Subcutaneous Q24H   gabapentin  600 mg Oral Q12H   insulin aspart  0-20 Units Subcutaneous TID WC   insulin aspart  0-5 Units Subcutaneous QHS   insulin aspart  4 Units Subcutaneous TID WC   insulin glargine-yfgn  15 Units Subcutaneous QHS   losartan  12.5 mg Oral QHS   sodium chloride flush  3 mL Intravenous Q12H   Continuous Infusions:  sodium chloride 10 mL/hr at 12/28/22 0600   sodium chloride Stopped (12/27/22 1426)   PRN Meds: sodium chloride, acetaminophen, morphine injection, ondansetron (ZOFRAN) IV, mouth rinse, senna, sodium chloride flush, traMADol   Vital Signs    Vitals:   12/29/22 1900 12/29/22 2000 12/29/22 2300 12/30/22 0700  BP:  126/88    Pulse:  100    Resp:  20    Temp: 98.9 F (37.2 C)  99.1 F (37.3 C) 98.3 F (36.8 C)  TempSrc: Oral  Oral Oral  SpO2:  96%    Weight:      Height:       No intake or output data in the 24 hours ending 12/30/22 0729 Filed Weights   12/23/22 2201  Weight: 87.1 kg    Physical Exam    GEN- The patient is well appearing, alert and oriented x 3 today.   Lungs- Clear to ausculation bilaterally, normal work of breathing Cardiac- Regular rate and rhythm, no murmurs, rubs or gallops GI- soft, NT, ND, + BS Extremities- no clubbing or cyanosis. No edema  Telemetry    NSR 90-100s (personally reviewed)  Hospital Course    57 y.o. male HTN, DM,  HLD admitted with late presenting Inferior STEMI and noted to have variable HB requiring temp wire initially.   Assessment & Plan    Variable heart block Had Mobitz 1 with intermittent 2:1 conduction No bradycardia and now s/p temp wire removal No urgent indication for pacing at this time.  Avoid AV nodal agents.   CAD s/p STEMI Cath with occl RCA but > 48 hrs of chest pain so no intervention with likely completed infarct  EP to see as needed while remains here.   For questions or updates, please contact [facility] Please consult [email or web address] for contact info under Cardiology/STEMI.  Signed, Graciella Freer, PA-C  12/30/2022, 7:29 AM   EP Attending  Patient seen and examined. Agree with above. The patient is doing well and anxious to go home. His AV conduction has returned and he is conducting nicely. No indication for PPM in the setting of CHB due to Inferior MI. I discussed the importance of lifestyle modification.    Sharlot Gowda Serai Tukes,MD

## 2022-12-30 NOTE — Inpatient Diabetes Management (Signed)
Inpatient Diabetes Program Recommendations  AACE/ADA: New Consensus Statement on Inpatient Glycemic Control (2015)  Target Ranges:  Prepandial:   less than 140 mg/dL      Peak postprandial:   less than 180 mg/dL (1-2 hours)      Critically ill patients:  140 - 180 mg/dL   Lab Results  Component Value Date   GLUCAP 80 12/30/2022   HGBA1C 13.2 (H) 12/23/2022    Review of Glycemic Control  Latest Reference Range & Units 12/29/22 11:26 12/29/22 16:14 12/29/22 21:36 12/30/22 06:56  Glucose-Capillary 70 - 99 mg/dL 78 086 (H) 578 (H) 80  (H): Data is abnormally high Diabetes history: DM2 Outpatient Diabetes medications: None Current orders for Inpatient glycemic control: Semglee 15 QHS, Novolog 0-20 TID with meals and 0-5 HS + 4 units TID, Jardiance 10 mg QD   HgbA1C - 13.2%   Inpatient Diabetes Program Recommendations:     In preparation for discharge: -Lantus 15 units QD insulin pen -Insulin pen needles -glucometer  Spoke with patient again to further reinforce discussions regarding diabetes and insulin.  Applied freestyle libre to patient's right upper arm, downloaded application on patient's phone device, reviewed risks/benefits and how to obtain additional sensors. Reviewed with patient when to check CBGs, when to call MD, importance of taking medication as prescribed, differences between long acting vs short acting insulin, importance of incorporating protein in with meals/snacks and overall survival skills. Patient has no further questions and appreciative of all information.   Thanks, Lujean Rave, MSN, RNC-OB Diabetes Coordinator [phone] (8a-5p)

## 2022-12-30 NOTE — Progress Notes (Signed)
Discussed/reviewed MI, restrictions, diet, exercise, NTG and CRPII. Voiced reception. Pt was unaware that juice affects blood sugars. He is motivated to work on diet and exercise. Will refer to [place] CRPII.  Ethelda Chick BS, ACSM-CEP 12/30/2022 12:01 PM

## 2022-12-30 NOTE — Progress Notes (Signed)
CARDIAC REHAB PHASE I   PRE:  Rate/Rhythm: 95 SR    BP: sitting 129/90    SpO2:   MODE:  Ambulation: 1100 ft   POST:  Rate/Rhythm: 113 ST    BP: sitting 151/96    SpO2:   Pt feeling great. Able to ambulate without c/o. Conduction in SR entire walk and in recliner at rest. BP elevated. Will await family to review education.  [id number]   Ethelda Chick BS, ACSM-CEP 12/30/2022 9:26 AM

## 2022-12-30 NOTE — TOC Progression Note (Addendum)
Transition of Care [facility]) - Progression Note    Patient Details  Name: Ryan Petersen MRN: [id number] Date of Birth: 09/22/1965  [facility]) CM/SW Contact  Elliot Cousin, RN Phone Number: [phone] 12/30/2022, 12:41 PM  Clinical Narrative:     Patient will pick up his meds for his pharmacy at [facility]. He has spoken to his PCP and will schedule a follow up appt. Daughter to provide transportation home.   Faxed D/C summary to [facility], Dr Pearson Grippe  Expected Discharge Plan: Home/Self Care Barriers to Discharge: No Barriers Identified  Expected Discharge Plan and Services   Discharge Planning Services: CM Consult   Living arrangements for the past 2 months: Single Family Home Expected Discharge Date: 12/30/22                                     Social Determinants of Health (SDOH) Interventions SDOH Screenings   Food Insecurity: No Food Insecurity (12/27/2022)  Housing: Low Risk  (12/27/2022)  Transportation Needs: No Transportation Needs (12/27/2022)  Utilities: Not At Risk (12/27/2022)  Tobacco Use: Unknown (12/24/2022)    Readmission Risk Interventions     No data to display

## 2023-01-10 ENCOUNTER — Ambulatory Visit: Payer: No Typology Code available for payment source | Admitting: Nurse Practitioner

## 2023-03-18 ENCOUNTER — Ambulatory Visit: Payer: No Typology Code available for payment source | Admitting: Dietician

## 2023-07-10 ENCOUNTER — Other Ambulatory Visit: Payer: Self-pay | Admitting: Cardiovascular Disease

## 2023-07-10 DIAGNOSIS — Z0000 Encounter for general adult medical examination without abnormal findings: Secondary | ICD-10-CM

## 2023-07-29 ENCOUNTER — Other Ambulatory Visit: Payer: Self-pay

## 2023-08-01 ENCOUNTER — Ambulatory Visit (INDEPENDENT_AMBULATORY_CARE_PROVIDER_SITE_OTHER): Payer: PRIVATE HEALTH INSURANCE

## 2023-08-01 DIAGNOSIS — Z0000 Encounter for general adult medical examination without abnormal findings: Secondary | ICD-10-CM | POA: Diagnosis not present
